# Patient Record
Sex: Female | Born: 2000 | Hispanic: Yes | Marital: Single | Smoking: Never smoker
Health system: Southern US, Community
[De-identification: ages and names within clinical notes are randomized; demographics above are authoritative.]

## PROBLEM LIST (undated history)

## (undated) DIAGNOSIS — J45909 Unspecified asthma, uncomplicated: Secondary | ICD-10-CM

## (undated) DIAGNOSIS — F419 Anxiety disorder, unspecified: Secondary | ICD-10-CM

## (undated) DIAGNOSIS — G90A Postural orthostatic tachycardia syndrome (POTS): Secondary | ICD-10-CM

## (undated) DIAGNOSIS — D649 Anemia, unspecified: Secondary | ICD-10-CM

## (undated) DIAGNOSIS — F32A Depression, unspecified: Secondary | ICD-10-CM

---
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
## 2021-09-30 ENCOUNTER — Encounter (HOSPITAL_BASED_OUTPATIENT_CLINIC_OR_DEPARTMENT_OTHER): Payer: Self-pay | Admitting: Obstetrics and Gynecology

## 2021-09-30 ENCOUNTER — Other Ambulatory Visit: Payer: Self-pay

## 2021-09-30 ENCOUNTER — Emergency Department (HOSPITAL_BASED_OUTPATIENT_CLINIC_OR_DEPARTMENT_OTHER)
Admission: EM | Admit: 2021-09-30 | Discharge: 2021-09-30 | Disposition: A | Discharge status: Home / Self Care | Payer: BC Managed Care – PPO | Attending: Emergency Medicine | Admitting: Emergency Medicine

## 2021-09-30 ENCOUNTER — Emergency Department (HOSPITAL_BASED_OUTPATIENT_CLINIC_OR_DEPARTMENT_OTHER): Payer: BC Managed Care – PPO

## 2021-09-30 DIAGNOSIS — D72829 Elevated white blood cell count, unspecified: Secondary | ICD-10-CM | POA: Diagnosis not present

## 2021-09-30 DIAGNOSIS — R112 Nausea with vomiting, unspecified: Secondary | ICD-10-CM | POA: Diagnosis present

## 2021-09-30 DIAGNOSIS — J45909 Unspecified asthma, uncomplicated: Secondary | ICD-10-CM | POA: Diagnosis not present

## 2021-09-30 DIAGNOSIS — K529 Noninfective gastroenteritis and colitis, unspecified: Secondary | ICD-10-CM | POA: Diagnosis not present

## 2021-09-30 HISTORY — DX: Depression, unspecified: F32.A

## 2021-09-30 HISTORY — DX: Anxiety disorder, unspecified: F41.9

## 2021-09-30 HISTORY — DX: Unspecified asthma, uncomplicated: J45.909

## 2021-09-30 LAB — URINALYSIS, ROUTINE W REFLEX MICROSCOPIC
Bilirubin Urine: NEGATIVE
Glucose, UA: NEGATIVE mg/dL
Hgb urine dipstick: NEGATIVE
Ketones, ur: 40 mg/dL — AB
Nitrite: NEGATIVE
Specific Gravity, Urine: 1.027 (ref 1.005–1.030)
pH: 6.5 (ref 5.0–8.0)

## 2021-09-30 LAB — LIPASE, BLOOD: Lipase: 10 U/L — ABNORMAL LOW (ref 11–51)

## 2021-09-30 LAB — CBC
HCT: 40.4 % (ref 36.0–46.0)
Hemoglobin: 13.7 g/dL (ref 12.0–15.0)
MCH: 29 pg (ref 26.0–34.0)
MCHC: 33.9 g/dL (ref 30.0–36.0)
MCV: 85.4 fL (ref 80.0–100.0)
Platelets: 336 10*3/uL (ref 150–400)
RBC: 4.73 MIL/uL (ref 3.87–5.11)
RDW: 12.2 % (ref 11.5–15.5)
WBC: 12 10*3/uL — ABNORMAL HIGH (ref 4.0–10.5)
nRBC: 0 % (ref 0.0–0.2)

## 2021-09-30 LAB — COMPREHENSIVE METABOLIC PANEL
ALT: 19 U/L (ref 0–44)
AST: 16 U/L (ref 15–41)
Albumin: 4.6 g/dL (ref 3.5–5.0)
Alkaline Phosphatase: 52 U/L (ref 38–126)
Anion gap: 12 (ref 5–15)
BUN: 13 mg/dL (ref 6–20)
CO2: 23 mmol/L (ref 22–32)
Calcium: 9.1 mg/dL (ref 8.9–10.3)
Chloride: 106 mmol/L (ref 98–111)
Creatinine, Ser: 0.54 mg/dL (ref 0.44–1.00)
GFR, Estimated: >60 mL/min (ref >60)
Glucose, Bld: 98 mg/dL (ref 70–99)
Potassium: 3.7 mmol/L (ref 3.5–5.1)
Sodium: 141 mmol/L (ref 135–145)
Total Bilirubin: 0.5 mg/dL (ref 0.3–1.2)
Total Protein: 7.3 g/dL (ref 6.5–8.1)

## 2021-09-30 LAB — PREGNANCY, URINE: Preg Test, Ur: NEGATIVE

## 2021-09-30 MED ORDER — ONDANSETRON HCL 4 MG PO TABS: 4.0000 mg | ORAL_TABLET | Freq: Four times a day (QID) | ORAL | 0 refills | Status: DC

## 2021-09-30 MED ORDER — IOHEXOL 300 MG/ML  SOLN
80.0000 mL | Freq: Once | INTRAMUSCULAR | Status: AC | PRN
  Administered 2021-09-30: 80 mL via INTRAVENOUS

## 2021-09-30 MED ORDER — ONDANSETRON HCL 4 MG/2ML IJ SOLN
4.0000 mg | Freq: Once | INTRAMUSCULAR | Status: AC | PRN
  Administered 2021-09-30: 4 mg via INTRAVENOUS
  Filled 2021-09-30: qty 2

## 2021-09-30 MED ORDER — DICYCLOMINE HCL 20 MG PO TABS: 20.0000 mg | ORAL_TABLET | Freq: Two times a day (BID) | ORAL | 0 refills | Status: DC

## 2021-09-30 MED ORDER — ACETAMINOPHEN 325 MG PO TABS
650.0000 mg | ORAL_TABLET | Freq: Once | ORAL | Status: AC
  Administered 2021-09-30: 650 mg via ORAL
  Filled 2021-09-30: qty 2

## 2021-09-30 MED ORDER — ONDANSETRON HCL 4 MG/2ML IJ SOLN
4.0000 mg | Freq: Once | INTRAMUSCULAR | Status: AC
  Administered 2021-09-30: 4 mg via INTRAVENOUS
  Filled 2021-09-30: qty 2

## 2021-09-30 MED ORDER — SODIUM CHLORIDE 0.9 % IV BOLUS
1000.0000 mL | Freq: Once | INTRAVENOUS | Status: AC
  Administered 2021-09-30: 1000 mL via INTRAVENOUS

## 2021-09-30 MED ORDER — DICYCLOMINE HCL 10 MG PO CAPS
10.0000 mg | ORAL_CAPSULE | Freq: Once | ORAL | Status: AC
  Administered 2021-09-30: 10 mg via ORAL
  Filled 2021-09-30: qty 1

## 2021-09-30 NOTE — ED Notes (Signed)
Tolerating PO fluids and crackers well, w/o nausea and/or emesis

## 2021-09-30 NOTE — Discharge Instructions (Signed)
The CT scan of your abdomen did not show any acute abnormalities including no evidence of a problem with your appendix, gallbladder kidneys or liver.  Your work-up was very reassuring today.  I suspect that you likely have viral gastroenteritis which is typically something that resolves on its own.  I prescribed a medication called Zofran and Bentyl to help with your nausea and abdominal discomfort.  Please take as directed.  Please follow-up with your regular doctor in the next 5 to 7 days for reassessment and return to the emergency department for any new or worsening symptoms.

## 2021-09-30 NOTE — ED Provider Notes (Signed)
[facility] EMERGENCY DEPT Provider Note   CSN: [id number] Arrival date & time: 09/30/21  1758     History Chief Complaint  Patient presents with   Emesis    Audrey Callahan is a 20 y.o. female.  HPI  20 year old female with a history of anxiety, asthma, depression, who presents to the emergency department today for evaluation of nausea and vomiting.  She also has had 1 episode of diarrhea and a normal stool today.  She has had some abdominal pain located to the periumbilical area.  She states that this pain is intermittent.  She denies any associated fevers or chills.  She denies any urinary or vaginal complaints.  She denies any sick contacts.  Past Medical History:  Diagnosis Date   Anxiety    Asthma    Depression     There are no problems to display for this patient.   History reviewed. No pertinent surgical history.   OB History     Gravida  0   Para  0   Term  0   Preterm  0   AB  0   Living  0      SAB  0   IAB  0   Ectopic  0   Multiple  0   Live Births  0           No family history on file.  Social History   Tobacco Use   Smoking status: Never    Passive exposure: Never   Smokeless tobacco: Never  Vaping Use   Vaping Use: Never used  Substance Use Topics   Alcohol use: Yes    Alcohol/week: 7.0 standard drinks    Types: 7 Glasses of wine per week   Drug use: Never    Home Medications Prior to Admission medications   Medication Sig Start Date End Date Taking? Authorizing Provider  dicyclomine (BENTYL) 20 MG tablet Take 1 tablet (20 mg total) by mouth 2 (two) times daily. 09/30/21  Yes Barbie Croston S, PA-C  ondansetron (ZOFRAN) 4 MG tablet Take 1 tablet (4 mg total) by mouth every 6 (six) hours. 09/30/21  Yes Irvan Tiedt S, PA-C    Allergies    Lidocaine viscous hcl  Review of Systems   Review of Systems  Constitutional:  Negative for chills and fever.  HENT:  Negative for ear pain and sore  throat.   Eyes:  Negative for visual disturbance.  Respiratory:  Negative for cough and shortness of breath.   Cardiovascular:  Negative for chest pain.  Gastrointestinal:  Positive for abdominal pain, diarrhea, nausea and vomiting.  Genitourinary:  Negative for dysuria and hematuria.  Musculoskeletal:  Negative for back pain.  Skin:  Negative for rash.  Neurological:  Negative for syncope and headaches.  All other systems reviewed and are negative.  Physical Exam Updated Vital Signs BP 104/60 (BP Location: Right Arm)   Pulse 91   Temp 99 F (37.2 C)   Resp 16   Ht 5' 4.5" (1.638 m)   Wt 56.7 kg   LMP 08/08/2021 (Exact Date) Comment: On BCP, Period q53months  SpO2 100%   BMI 21.12 kg/m   Physical Exam Vitals and nursing note reviewed.  Constitutional:      General: She is not in acute distress.    Appearance: She is well-developed.  HENT:     Head: Normocephalic and atraumatic.  Eyes:     Conjunctiva/sclera: Conjunctivae normal.  Cardiovascular:     Rate  and Rhythm: Normal rate and regular rhythm.     Heart sounds: Normal heart sounds. No murmur heard. Pulmonary:     Effort: Pulmonary effort is normal. No respiratory distress.     Breath sounds: Normal breath sounds. No wheezing, rhonchi or rales.  Abdominal:     General: Bowel sounds are normal.     Palpations: Abdomen is soft.     Tenderness: There is abdominal tenderness (ruq, periumbilical). There is no guarding or rebound.  Musculoskeletal:     Cervical back: Neck supple.  Skin:    General: Skin is warm and dry.  Neurological:     Mental Status: She is alert.    ED Results / Procedures / Treatments   Labs (all labs ordered are listed, but only abnormal results are displayed) Labs Reviewed  LIPASE, BLOOD - Abnormal; Notable for the following components:      Result Value   Lipase 10 (*)    All other components within normal limits  CBC - Abnormal; Notable for the following components:   WBC 12.0 (*)     All other components within normal limits  URINALYSIS, ROUTINE W REFLEX MICROSCOPIC - Abnormal; Notable for the following components:   Ketones, ur 40 (*)    Protein, ur TRACE (*)    Leukocytes,Ua SMALL (*)    Bacteria, UA RARE (*)    All other components within normal limits  COMPREHENSIVE METABOLIC PANEL  PREGNANCY, URINE    EKG None  Radiology CT ABDOMEN PELVIS W CONTRAST  Result Date: 09/30/2021 CLINICAL DATA:  Abdominal pain EXAM: CT ABDOMEN AND PELVIS WITH CONTRAST TECHNIQUE: Multidetector CT imaging of the abdomen and pelvis was performed using the standard protocol following bolus administration of intravenous contrast. CONTRAST:  51mL OMNIPAQUE IOHEXOL 300 MG/ML  SOLN COMPARISON:  None. FINDINGS: Lower chest: Lung bases are clear. Hepatobiliary: Liver is within normal limits. Gallbladder is unremarkable. No intrahepatic or extrahepatic ductal dilatation. Pancreas: Within normal limits. Spleen: Within normal limits Adrenals/Urinary Tract: Adrenal glands are within normal limits. Kidneys are within normal limits.  No hydronephrosis. Bladder is within normal limits. Stomach/Bowel: Stomach is within normal limits. No evidence of bowel obstruction. Normal appendix (series 2/image 65). No colonic wall thickening or inflammatory changes. Vascular/Lymphatic: No evidence of abdominal aortic aneurysm. No suspicious abdominopelvic lymphadenopathy. Reproductive: Retroverted uterus. Bilateral ovaries are within normal limits. Other: No abdominopelvic ascites. Musculoskeletal: Visualized osseous structures are within normal limits. IMPRESSION: Negative CT abdomen/pelvis. Electronically Signed   By: Charline Bills M.D.   On: 09/30/2021 20:44    Procedures Procedures   Medications Ordered in ED Medications  ondansetron (ZOFRAN) injection 4 mg (4 mg Intravenous Given 09/30/21 1823)  sodium chloride 0.9 % bolus 1,000 mL (0 mLs Intravenous Stopped 09/30/21 2108)  ondansetron (ZOFRAN) injection 4  mg (4 mg Intravenous Given 09/30/21 1953)  dicyclomine (BENTYL) capsule 10 mg (10 mg Oral Given 09/30/21 1953)  acetaminophen (TYLENOL) tablet 650 mg (650 mg Oral Given 09/30/21 1953)  iohexol (OMNIPAQUE) 300 MG/ML solution 80 mL (80 mLs Intravenous Contrast Given 09/30/21 2025)    ED Course  I have reviewed the triage vital signs and the nursing notes.  Pertinent labs & imaging results that were available during my care of the patient were reviewed by me and considered in my medical decision making (see chart for details).    MDM Rules/Calculators/A&P  20 y/o f presents for eval of abd pain, nvd that started earlier today.   Reviewed/interpreted labs CBC with mild leukocytosis, no anemia CMP unremarkable Lipase neg UA with 40 ketones, trace protein, smal leuks, 6-10 wbc abd rare bacteria. Doubt uti Preg test neg  Reviewed/interpreted imaging CT abd/pelvis - neg for acute abnormality  Patient was given IV fluids, antiemetics and Bentyl.  On reassessment she has been able to tolerate p.o.  I have low suspicion for any emergent intra-abdominal/pelvic process at this time.  Feel she likely has gastroenteritis.  We will treat her with Zofran and Bentyl as an outpatient advised hydration and supportive care.  Advise close follow-up and strict return precautions.  She voices understanding of the plan and reasons to return.  All questions answered.  Patient stable for discharge.   Final Clinical Impression(s) / ED Diagnoses Final diagnoses:  Gastroenteritis    Rx / DC Orders ED Discharge Orders          Ordered    ondansetron (ZOFRAN) 4 MG tablet  Every 6 hours        09/30/21 2112    dicyclomine (BENTYL) 20 MG tablet  2 times daily        09/30/21 2112             Karrie Meres, [place] 09/30/21 2112    Tegeler, Canary Brim, MD 10/01/21 [phone]

## 2021-09-30 NOTE — ED Triage Notes (Signed)
Patient reports to the ER for emesis. Patient reports since 3am this morning she has had harsh emesis that is uncontrolled. Reports this has happened in the past and needed medication to help calm the emesis.

## 2021-09-30 NOTE — ED Notes (Signed)
Pt ambulated to bathroom to provide urine specimen, gait steady. Urine specimen obtained and sent to lab.

## 2021-09-30 NOTE — ED Notes (Signed)
Discharge instructions discussed with pt. Pt verbalized understanding. Pt stable and ambulatory. No signature pad available. 

## 2022-01-18 ENCOUNTER — Other Ambulatory Visit: Payer: Self-pay

## 2022-01-18 ENCOUNTER — Other Ambulatory Visit: Payer: Self-pay | Admitting: Gastroenterology

## 2022-01-18 ENCOUNTER — Ambulatory Visit
Admission: RE | Admit: 2022-01-18 | Discharge: 2022-01-18 | Disposition: A | Discharge status: Home / Self Care | Payer: BC Managed Care – PPO | Source: Ambulatory Visit | Attending: Gastroenterology | Admitting: Gastroenterology

## 2022-01-18 DIAGNOSIS — K5900 Constipation, unspecified: Secondary | ICD-10-CM

## 2022-04-02 ENCOUNTER — Other Ambulatory Visit (HOSPITAL_COMMUNITY)
Admission: RE | Admit: 2022-04-02 | Discharge: 2022-04-02 | Disposition: A | Discharge status: Home / Self Care | Payer: BC Managed Care – PPO | Source: Ambulatory Visit | Attending: Nurse Practitioner | Admitting: Nurse Practitioner

## 2022-04-02 DIAGNOSIS — R87612 Low grade squamous intraepithelial lesion on cytologic smear of cervix (LGSIL): Secondary | ICD-10-CM | POA: Insufficient documentation

## 2022-04-09 LAB — CYTOLOGY - PAP: Diagnosis: NEGATIVE

## 2022-07-04 ENCOUNTER — Other Ambulatory Visit: Payer: Self-pay | Admitting: Otolaryngology

## 2022-07-30 ENCOUNTER — Emergency Department (HOSPITAL_BASED_OUTPATIENT_CLINIC_OR_DEPARTMENT_OTHER)
Admission: EM | Admit: 2022-07-30 | Discharge: 2022-07-30 | Disposition: A | Discharge status: Home / Self Care | Payer: BC Managed Care – PPO | Attending: Emergency Medicine | Admitting: Emergency Medicine

## 2022-07-30 ENCOUNTER — Other Ambulatory Visit: Payer: Self-pay

## 2022-07-30 ENCOUNTER — Encounter (HOSPITAL_BASED_OUTPATIENT_CLINIC_OR_DEPARTMENT_OTHER): Payer: Self-pay | Admitting: Emergency Medicine

## 2022-07-30 DIAGNOSIS — T7840XA Allergy, unspecified, initial encounter: Secondary | ICD-10-CM

## 2022-07-30 DIAGNOSIS — R21 Rash and other nonspecific skin eruption: Secondary | ICD-10-CM | POA: Diagnosis present

## 2022-07-30 DIAGNOSIS — L509 Urticaria, unspecified: Secondary | ICD-10-CM | POA: Insufficient documentation

## 2022-07-30 MED ORDER — PREDNISONE 10 MG PO TABS: 40.0000 mg | ORAL_TABLET | Freq: Every day | ORAL | 0 refills | Status: AC

## 2022-07-30 MED ORDER — FAMOTIDINE 20 MG PO TABS
20.0000 mg | ORAL_TABLET | Freq: Once | ORAL | Status: AC
  Administered 2022-07-30: 20 mg via ORAL
  Filled 2022-07-30: qty 1

## 2022-07-30 MED ORDER — DIPHENHYDRAMINE HCL 25 MG PO CAPS
25.0000 mg | ORAL_CAPSULE | Freq: Once | ORAL | Status: AC
  Administered 2022-07-30: 25 mg via ORAL
  Filled 2022-07-30: qty 1

## 2022-07-30 MED ORDER — PREDNISONE 50 MG PO TABS
60.0000 mg | ORAL_TABLET | Freq: Once | ORAL | Status: AC
  Administered 2022-07-30: 60 mg via ORAL
  Filled 2022-07-30: qty 1

## 2022-07-30 NOTE — ED Triage Notes (Signed)
Started at 2pm, stared with rash on face and now is complaining of difficulty swallowing, harshness of voice Tongue is pink and slightly swollen. Controlling secretions at this time  Took OTC benadryl at home Unsure cause, denies new meds, foods , products

## 2022-07-30 NOTE — Discharge Instructions (Signed)
You were seen in the emergency department for your allergic reaction.  You had no signs of anaphylaxis that required an EpiPen.  We gave you some Benadryl, Pepcid and steroids with improvement of your rash, itching and swelling.  I have given you a short course of steroids that you should complete as prescribed and you can continue to take Benadryl or Pepcid as needed for any itching or rash.  You should follow-up with your primary doctor in the next few days to have your symptoms rechecked and you can follow-up with an allergist as needed.  You should return to the emergency department if you have tongue or throat swelling, you have shortness of breath, you have repetitive vomiting, you pass out, or if you have any other new or concerning symptoms.

## 2022-07-30 NOTE — ED Notes (Signed)
RT in to assess. Airway patent with BBS clear and no stridor noted. SpO2 99% at this time.

## 2022-07-30 NOTE — ED Provider Notes (Signed)
[facility] EMERGENCY DEPT Provider Note   CSN: [id number] Arrival date & time: 07/30/22  1936     History  Chief Complaint  Patient presents with   Allergic Reaction    Audrey Callahan is a 21 y.o. female.  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  The patient states that she took a shower last night and wash her face with her normal face wash.  She states that she did not place anything on her face this morning and had not had anything yet for breakfast and around 10 AM started to notice a rash and swelling with itching to the left side of her face.  She states it did not go away by noon so she took some Benadryl.  She states around 2:00 she started to feel like her throat was swelling.  She states that she continue to monitor her symptoms throughout the day and since they do not resolve decided to come to the emergency department.  She denies any nausea, vomiting.  She states she did have 1 episode of diarrhea this morning.  She denies any rash on her arms or legs.  She denies any shortness of breath.  The history is provided by the patient.  Allergic Reaction      Home Medications Prior to Admission medications   Medication Sig Start Date End Date Taking? Authorizing Provider  dicyclomine (BENTYL) 20 MG tablet Take 1 tablet (20 mg total) by mouth 2 (two) times daily. 09/30/21   Couture, Cortni S, PA-C  ondansetron (ZOFRAN) 4 MG tablet Take 1 tablet (4 mg total) by mouth every 6 (six) hours. 09/30/21   Couture, Cortni S, PA-C      Allergies    Lidocaine viscous hcl    Review of Systems   Review of Systems  Physical Exam Updated Vital Signs BP 114/85   Pulse 73   Temp 98 F (36.7 C)   Resp (!) 21   LMP 06/17/2022 (Approximate)   SpO2 98%  Physical Exam Vitals and nursing note reviewed.  Constitutional:      General: She is not in  acute distress.    Appearance: Normal appearance.  HENT:     Head: Normocephalic and atraumatic.     Nose: Nose normal.     Mouth/Throat:     Mouth: Mucous membranes are moist.     Pharynx: Oropharynx is clear.     Comments: No tongue swelling, no swelling to posterior oropharynx, normal phonation, tolerating secretions Eyes:     Extraocular Movements: Extraocular movements intact.     Conjunctiva/sclera: Conjunctivae normal.  Cardiovascular:     Rate and Rhythm: Normal rate and regular rhythm.     Pulses: Normal pulses.  Pulmonary:     Effort: Pulmonary effort is normal. No respiratory distress.     Breath sounds: No wheezing.  Abdominal:     General: Abdomen is flat.     Palpations: Abdomen is soft.     Tenderness: There is no abdominal tenderness.  Musculoskeletal:        General: Normal range of motion.     Cervical back: Normal range of motion and neck supple.  Skin:    General: Skin is warm and dry.     Comments: Small areas of hives on left cheek, no hives visualized on arms or legs or thorax  Neurological:     General:  No focal deficit present.     Mental Status: She is alert and oriented to person, place, and time.  Psychiatric:        Mood and Affect: Mood normal.        Behavior: Behavior normal.     ED Results / Procedures / Treatments   Labs (all labs ordered are listed, but only abnormal results are displayed) Labs Reviewed - No data to display  EKG None  Radiology No results found.  Procedures Procedures    Medications Ordered in ED Medications  diphenhydrAMINE (BENADRYL) capsule 25 mg (25 mg Oral Given 07/30/22 2253)  predniSONE (DELTASONE) tablet 60 mg (60 mg Oral Given 07/30/22 2253)  famotidine (PEPCID) tablet 20 mg (20 mg Oral Given 07/30/22 2254)    ED Course/ Medical Decision Making/ A&P                           Medical Decision Making This patient presents to the ED with chief complaint(s) of rash, itching and throat swelling with  pertinent past medical history of allergic reactions which further complicates the presenting complaint. The complaint involves an extensive differential diagnosis and also carries with it a high risk of complications and morbidity.    The differential diagnosis includes patient has no wheezing, no swelling of her oropharynx and is hemodynamically stable here without signs of anaphylaxis.  She does have some hives to her left cheek with itching, concerning for allergic reaction.  She will be given an additional dose of Benadryl as well as steroids and Pepcid and be monitored for 1 hour and reassess.  Additional history obtained: N/A  ED Course and Reassessment: Upon reassessment, the patient reports improvement of her symptoms.  She continues to have no swelling to her oropharynx and lungs remain clear and she is hemodynamically stable.  She is stable for discharge home.  She was given a short course of steroids and was recommended to take Benadryl and Pepcid as needed.  She was given strict return precautions.  Independent labs interpretation:  The following labs were independently interpreted: N/A  Independent visualization of imaging: N/A  Consultation: - Consulted or discussed management/test interpretation w/ external professional: NA  Consideration for admission or further workup:N/A Social Determinants of health:N/A    Risk Prescription drug management.           Final Clinical Impression(s) / ED Diagnoses Final diagnoses:  None    Rx / DC Orders ED Discharge Orders     None         Phoebe Sharps, DO 07/30/22 2348

## 2022-07-30 NOTE — ED Notes (Signed)
Pt discharged home after verbalizing understanding of discharge instructions; nad noted. 

## 2022-08-27 ENCOUNTER — Other Ambulatory Visit: Payer: Self-pay

## 2022-08-27 ENCOUNTER — Encounter (HOSPITAL_COMMUNITY): Payer: Self-pay | Admitting: Otolaryngology

## 2022-08-27 NOTE — Progress Notes (Signed)
SDW CALL  Patient was given pre-op instructions over the phone. The opportunity was given for the patient to ask questions. No further questions asked. Patient verbalized understanding of instructions given.   PCP - Peri Maris, FNP   PPM/ICD - denies   Chest x-ray - denies EKG - requested Stress Test - requested ECHO - denies Cardiac Cath - denies  Sleep Study - denies    Blood Thinner Instructions: n/a Aspirin Instructions: n/a  ERAS Protcol - clears until 1000   COVID TEST- n/a   Anesthesia review: no  Patient denies shortness of breath, fever, cough and chest pain over the phone call   All instructions explained to the patient, with a verbal understanding of the material. Patient agrees to go over the instructions while at home for a better understanding.

## 2022-08-27 NOTE — Anesthesia Preprocedure Evaluation (Addendum)
Anesthesia Evaluation  Patient identified by MRN, date of birth, ID band Patient awake    Reviewed: Allergy & Precautions, NPO status , Patient's Chart, lab work & pertinent test results  Airway Mallampati: I  TM Distance: >3 FB Neck ROM: Full    Dental no notable dental hx.    Pulmonary asthma (well controlled) ,    Pulmonary exam normal breath sounds clear to auscultation       Cardiovascular negative cardio ROS Normal cardiovascular exam Rhythm:Regular Rate:Normal     Neuro/Psych PSYCHIATRIC DISORDERS Anxiety Depression negative neurological ROS     GI/Hepatic Neg liver ROS, GERD  Controlled and Medicated,  Endo/Other  negative endocrine ROS  Renal/GU negative Renal ROS  negative genitourinary   Musculoskeletal negative musculoskeletal ROS (+)   Abdominal   Peds  Hematology negative hematology ROS (+)   Anesthesia Other Findings   Reproductive/Obstetrics negative OB ROS                            Anesthesia Physical Anesthesia Plan  ASA: 2  Anesthesia Plan: General   Post-op Pain Management: Tylenol PO (pre-op)*   Induction: Intravenous  PONV Risk Score and Plan: 4 or greater and Ondansetron, Dexamethasone, Midazolam, Scopolamine patch - Pre-op and Treatment may vary due to age or medical condition  Airway Management Planned: Oral ETT  Additional Equipment: None  Intra-op Plan:   Post-operative Plan: Extubation in OR  Informed Consent: I have reviewed the patients History and Physical, chart, labs and discussed the procedure including the risks, benefits and alternatives for the proposed anesthesia with the patient or authorized representative who has indicated his/her understanding and acceptance.     Dental advisory given  Plan Discussed with: CRNA  Anesthesia Plan Comments:        Anesthesia Quick Evaluation

## 2022-08-28 ENCOUNTER — Other Ambulatory Visit: Payer: Self-pay

## 2022-08-28 ENCOUNTER — Emergency Department (HOSPITAL_BASED_OUTPATIENT_CLINIC_OR_DEPARTMENT_OTHER): Payer: BC Managed Care – PPO

## 2022-08-28 ENCOUNTER — Encounter (HOSPITAL_BASED_OUTPATIENT_CLINIC_OR_DEPARTMENT_OTHER): Payer: Self-pay | Admitting: Emergency Medicine

## 2022-08-28 ENCOUNTER — Encounter (HOSPITAL_COMMUNITY): Payer: Self-pay | Admitting: Otolaryngology

## 2022-08-28 ENCOUNTER — Inpatient Hospital Stay (HOSPITAL_BASED_OUTPATIENT_CLINIC_OR_DEPARTMENT_OTHER)
Admission: EM | Admit: 2022-08-28 | Discharge: 2022-09-01 | DRG: 987 | Disposition: A | Discharge status: Home / Self Care | Payer: BC Managed Care – PPO | Attending: Pulmonary Disease | Admitting: Pulmonary Disease

## 2022-08-28 ENCOUNTER — Ambulatory Visit (HOSPITAL_COMMUNITY): Payer: BC Managed Care – PPO | Admitting: Anesthesiology

## 2022-08-28 ENCOUNTER — Encounter (HOSPITAL_COMMUNITY)
Admission: RE | Disposition: A | Discharge status: Home / Self Care | Payer: Self-pay | Source: Home / Self Care | Attending: Otolaryngology

## 2022-08-28 ENCOUNTER — Ambulatory Visit (HOSPITAL_COMMUNITY)
Admission: RE | Admit: 2022-08-28 | Discharge: 2022-08-28 | Disposition: A | Discharge status: Home / Self Care | Payer: BC Managed Care – PPO | Source: Home / Self Care | Attending: Otolaryngology | Admitting: Otolaryngology

## 2022-08-28 ENCOUNTER — Encounter (HOSPITAL_COMMUNITY): Payer: Self-pay

## 2022-08-28 ENCOUNTER — Other Ambulatory Visit (HOSPITAL_COMMUNITY): Payer: Self-pay

## 2022-08-28 DIAGNOSIS — Z91048 Other nonmedicinal substance allergy status: Secondary | ICD-10-CM

## 2022-08-28 DIAGNOSIS — R040 Epistaxis: Secondary | ICD-10-CM | POA: Diagnosis present

## 2022-08-28 DIAGNOSIS — Z884 Allergy status to anesthetic agent status: Secondary | ICD-10-CM

## 2022-08-28 DIAGNOSIS — J342 Deviated nasal septum: Secondary | ICD-10-CM | POA: Diagnosis present

## 2022-08-28 DIAGNOSIS — R0981 Nasal congestion: Secondary | ICD-10-CM | POA: Diagnosis present

## 2022-08-28 DIAGNOSIS — J3489 Other specified disorders of nose and nasal sinuses: Secondary | ICD-10-CM | POA: Insufficient documentation

## 2022-08-28 DIAGNOSIS — J9589 Other postprocedural complications and disorders of respiratory system, not elsewhere classified: Principal | ICD-10-CM | POA: Diagnosis present

## 2022-08-28 DIAGNOSIS — G928 Other toxic encephalopathy: Secondary | ICD-10-CM | POA: Diagnosis present

## 2022-08-28 DIAGNOSIS — J343 Hypertrophy of nasal turbinates: Secondary | ICD-10-CM | POA: Insufficient documentation

## 2022-08-28 DIAGNOSIS — I248 Other forms of acute ischemic heart disease: Secondary | ICD-10-CM | POA: Diagnosis not present

## 2022-08-28 DIAGNOSIS — J690 Pneumonitis due to inhalation of food and vomit: Secondary | ICD-10-CM | POA: Diagnosis present

## 2022-08-28 DIAGNOSIS — J45909 Unspecified asthma, uncomplicated: Secondary | ICD-10-CM | POA: Diagnosis present

## 2022-08-28 DIAGNOSIS — T380X5A Adverse effect of glucocorticoids and synthetic analogues, initial encounter: Secondary | ICD-10-CM | POA: Diagnosis present

## 2022-08-28 DIAGNOSIS — I428 Other cardiomyopathies: Secondary | ICD-10-CM | POA: Diagnosis present

## 2022-08-28 DIAGNOSIS — T8859XA Other complications of anesthesia, initial encounter: Secondary | ICD-10-CM | POA: Diagnosis present

## 2022-08-28 DIAGNOSIS — K219 Gastro-esophageal reflux disease without esophagitis: Secondary | ICD-10-CM | POA: Diagnosis present

## 2022-08-28 DIAGNOSIS — Y708 Miscellaneous anesthesiology devices associated with adverse incidents, not elsewhere classified: Secondary | ICD-10-CM | POA: Diagnosis present

## 2022-08-28 DIAGNOSIS — Y838 Other surgical procedures as the cause of abnormal reaction of the patient, or of later complication, without mention of misadventure at the time of the procedure: Secondary | ICD-10-CM | POA: Diagnosis present

## 2022-08-28 DIAGNOSIS — F32A Depression, unspecified: Secondary | ICD-10-CM | POA: Diagnosis present

## 2022-08-28 DIAGNOSIS — J9601 Acute respiratory failure with hypoxia: Secondary | ICD-10-CM

## 2022-08-28 DIAGNOSIS — M950 Acquired deformity of nose: Secondary | ICD-10-CM | POA: Insufficient documentation

## 2022-08-28 DIAGNOSIS — Z20822 Contact with and (suspected) exposure to covid-19: Secondary | ICD-10-CM | POA: Diagnosis present

## 2022-08-28 DIAGNOSIS — I5181 Takotsubo syndrome: Secondary | ICD-10-CM | POA: Diagnosis present

## 2022-08-28 DIAGNOSIS — J189 Pneumonia, unspecified organism: Secondary | ICD-10-CM | POA: Diagnosis present

## 2022-08-28 DIAGNOSIS — I420 Dilated cardiomyopathy: Secondary | ICD-10-CM | POA: Diagnosis present

## 2022-08-28 DIAGNOSIS — I5021 Acute systolic (congestive) heart failure: Secondary | ICD-10-CM

## 2022-08-28 DIAGNOSIS — I959 Hypotension, unspecified: Secondary | ICD-10-CM

## 2022-08-28 DIAGNOSIS — E739 Lactose intolerance, unspecified: Secondary | ICD-10-CM | POA: Diagnosis present

## 2022-08-28 DIAGNOSIS — G4700 Insomnia, unspecified: Secondary | ICD-10-CM | POA: Diagnosis present

## 2022-08-28 DIAGNOSIS — R7989 Other specified abnormal findings of blood chemistry: Secondary | ICD-10-CM

## 2022-08-28 HISTORY — DX: Anemia, unspecified: D64.9

## 2022-08-28 HISTORY — PX: NASAL HEMORRHAGE CONTROL: SHX287

## 2022-08-28 HISTORY — PX: NASAL SEPTUM SURGERY: SHX37

## 2022-08-28 HISTORY — PX: NASAL SEPTOPLASTY W/ TURBINOPLASTY: SHX2070

## 2022-08-28 LAB — CBC
HCT: 39.8 % (ref 36.0–46.0)
HCT: 39.8 % (ref 36.0–46.0)
Hemoglobin: 13.5 g/dL (ref 12.0–15.0)
Hemoglobin: 13.7 g/dL (ref 12.0–15.0)
MCH: 29.5 pg (ref 26.0–34.0)
MCH: 29.5 pg (ref 26.0–34.0)
MCHC: 33.9 g/dL (ref 30.0–36.0)
MCHC: 34.4 g/dL (ref 30.0–36.0)
MCV: 87.1 fL (ref 80.0–100.0)
MCV: 85.6 fL (ref 80.0–100.0)
Platelets: 298 10*3/uL (ref 150–400)
Platelets: 326 10*3/uL (ref 150–400)
RBC: 4.57 MIL/uL (ref 3.87–5.11)
RBC: 4.65 MIL/uL (ref 3.87–5.11)
RDW: 12.5 % (ref 11.5–15.5)
RDW: 12.4 % (ref 11.5–15.5)
WBC: 5.5 10*3/uL (ref 4.0–10.5)
WBC: 16.1 10*3/uL — ABNORMAL HIGH (ref 4.0–10.5)
nRBC: 0 % (ref 0.0–0.2)
nRBC: 0 % (ref 0.0–0.2)

## 2022-08-28 LAB — BASIC METABOLIC PANEL
Anion gap: 12 (ref 5–15)
BUN: 13 mg/dL (ref 6–20)
CO2: 22 mmol/L (ref 22–32)
Calcium: 8.8 mg/dL — ABNORMAL LOW (ref 8.9–10.3)
Chloride: 100 mmol/L (ref 98–111)
Creatinine, Ser: 0.62 mg/dL (ref 0.44–1.00)
GFR, Estimated: >60 mL/min (ref >60)
Glucose, Bld: 168 mg/dL — ABNORMAL HIGH (ref 70–99)
Potassium: 4.1 mmol/L (ref 3.5–5.1)
Sodium: 134 mmol/L — ABNORMAL LOW (ref 135–145)

## 2022-08-28 LAB — LACTIC ACID, PLASMA: Lactic Acid, Venous: 1.8 mmol/L (ref 0.5–1.9)

## 2022-08-28 LAB — SARS CORONAVIRUS 2 BY RT PCR: SARS Coronavirus 2 by RT PCR: NEGATIVE

## 2022-08-28 LAB — POCT PREGNANCY, URINE: Preg Test, Ur: NEGATIVE

## 2022-08-28 SURGERY — SEPTOPLASTY, NOSE, WITH NASAL TURBINATE REDUCTION
Anesthesia: General | Site: Nose | Laterality: Bilateral

## 2022-08-28 MED ORDER — ORAL CARE MOUTH RINSE
15.0000 mL | OROMUCOSAL | Status: DC | PRN
  Filled 2022-08-28: qty 15

## 2022-08-28 MED ORDER — ACETAMINOPHEN 500 MG PO TABS
1000.0000 mg | ORAL_TABLET | Freq: Once | ORAL | Status: DC
  Filled 2022-08-28: qty 2

## 2022-08-28 MED ORDER — IPRATROPIUM BROMIDE 0.02 % IN SOLN
0.5000 mg | Freq: Once | RESPIRATORY_TRACT | Status: AC
  Administered 2022-08-28: 0.5 mg via RESPIRATORY_TRACT
  Filled 2022-08-28: qty 2.5

## 2022-08-28 MED ORDER — DEXMEDETOMIDINE HCL IN NACL 80 MCG/20ML IV SOLN: INTRAVENOUS | Status: DC | PRN

## 2022-08-28 MED ORDER — BACITRACIN ZINC 500 UNIT/GM EX OINT
TOPICAL_OINTMENT | CUTANEOUS | Status: AC
  Filled 2022-08-28: qty 28.35

## 2022-08-28 MED ORDER — CEFAZOLIN SODIUM-DEXTROSE 2-4 GM/100ML-% IV SOLN
2.0000 g | INTRAVENOUS | Status: AC
  Administered 2022-08-28: 2 g via INTRAVENOUS
  Filled 2022-08-28: qty 100

## 2022-08-28 MED ORDER — LIDOCAINE-EPINEPHRINE 1 %-1:100000 IJ SOLN
INTRAMUSCULAR | Status: AC
  Filled 2022-08-28: qty 1

## 2022-08-28 MED ORDER — EPHEDRINE SULFATE-NACL 50-0.9 MG/10ML-% IV SOSY
PREFILLED_SYRINGE | INTRAVENOUS | Status: DC | PRN
  Administered 2022-08-28: 10 mg via INTRAVENOUS

## 2022-08-28 MED ORDER — ONDANSETRON HCL 4 MG/2ML IJ SOLN
INTRAMUSCULAR | Status: DC | PRN
  Administered 2022-08-28: 4 mg via INTRAVENOUS

## 2022-08-28 MED ORDER — CHLORHEXIDINE GLUCONATE CLOTH 2 % EX PADS
6.0000 | MEDICATED_PAD | Freq: Every day | CUTANEOUS | Status: DC
  Administered 2022-08-29 – 2022-08-31 (×3): 6 via TOPICAL
  Filled 2022-08-28: qty 6

## 2022-08-28 MED ORDER — PROCHLORPERAZINE EDISYLATE 10 MG/2ML IJ SOLN
5.0000 mg | Freq: Once | INTRAMUSCULAR | Status: AC
  Administered 2022-08-28: 5 mg via INTRAVENOUS
  Filled 2022-08-28: qty 2

## 2022-08-28 MED ORDER — EPINEPHRINE HCL (NASAL) 0.1 % NA SOLN
NASAL | Status: AC
  Filled 2022-08-28: qty 30

## 2022-08-28 MED ORDER — PHENYLEPHRINE 80 MCG/ML (10ML) SYRINGE FOR IV PUSH (FOR BLOOD PRESSURE SUPPORT)
PREFILLED_SYRINGE | INTRAVENOUS | Status: AC
  Filled 2022-08-28: qty 10

## 2022-08-28 MED ORDER — FUROSEMIDE 10 MG/ML IJ SOLN: 120.0000 mg | Freq: Once | INTRAVENOUS | Status: DC

## 2022-08-28 MED ORDER — ONDANSETRON HCL 4 MG PO TABS
4.0000 mg | ORAL_TABLET | Freq: Three times a day (TID) | ORAL | 0 refills | Status: AC | PRN
  Filled 2022-08-28 (×2): qty 21, 7d supply, fill #0

## 2022-08-28 MED ORDER — DEXAMETHASONE SODIUM PHOSPHATE 10 MG/ML IJ SOLN
INTRAMUSCULAR | Status: DC | PRN
  Administered 2022-08-28: 10 mg via INTRAVENOUS

## 2022-08-28 MED ORDER — ORAL CARE MOUTH RINSE
15.0000 mL | OROMUCOSAL | Status: DC
  Administered 2022-08-29 – 2022-09-01 (×9): 15 mL via OROMUCOSAL
  Filled 2022-08-28: qty 15

## 2022-08-28 MED ORDER — OXYCODONE HCL 5 MG/5ML PO SOLN: 5.0000 mg | Freq: Once | ORAL | Status: DC | PRN

## 2022-08-28 MED ORDER — BACITRACIN 500 UNIT/GM EX OINT
TOPICAL_OINTMENT | CUTANEOUS | Status: DC | PRN
  Administered 2022-08-28: 1

## 2022-08-28 MED ORDER — PROPOFOL 10 MG/ML IV BOLUS
INTRAVENOUS | Status: AC
  Filled 2022-08-28: qty 20

## 2022-08-28 MED ORDER — LIDOCAINE-EPINEPHRINE 1 %-1:100000 IJ SOLN
INTRAMUSCULAR | Status: DC | PRN
  Administered 2022-08-28: 16 mL

## 2022-08-28 MED ORDER — FENTANYL CITRATE PF 50 MCG/ML IJ SOSY: 100.0000 ug | PREFILLED_SYRINGE | Freq: Once | INTRAMUSCULAR | Status: DC

## 2022-08-28 MED ORDER — SODIUM CHLORIDE 0.9 % IV BOLUS: 1000.0000 mL | Freq: Once | INTRAVENOUS | Status: DC

## 2022-08-28 MED ORDER — AMISULPRIDE (ANTIEMETIC) 5 MG/2ML IV SOLN: 10.0000 mg | Freq: Once | INTRAVENOUS | Status: DC | PRN

## 2022-08-28 MED ORDER — PIPERACILLIN-TAZOBACTAM 3.375 G IVPB 30 MIN
3.3750 g | Freq: Once | INTRAVENOUS | Status: AC
  Administered 2022-08-28: 3.375 g via INTRAVENOUS
  Filled 2022-08-28: qty 50

## 2022-08-28 MED ORDER — EPINEPHRINE HCL (NASAL) 0.1 % NA SOLN
NASAL | Status: DC | PRN
  Administered 2022-08-28: 1 mL via TOPICAL

## 2022-08-28 MED ORDER — ESMOLOL HCL 100 MG/10ML IV SOLN
INTRAVENOUS | Status: DC | PRN
  Administered 2022-08-28: 30 mg via INTRAVENOUS
  Administered 2022-08-28: 20 mg via INTRAVENOUS

## 2022-08-28 MED ORDER — DEXMEDETOMIDINE HCL IN NACL 80 MCG/20ML IV SOLN
INTRAVENOUS | Status: DC | PRN
  Administered 2022-08-28: 12 ug via INTRAVENOUS
  Administered 2022-08-28: 8 ug via INTRAVENOUS

## 2022-08-28 MED ORDER — MEPERIDINE HCL 25 MG/ML IJ SOLN: 6.2500 mg | INTRAMUSCULAR | Status: DC | PRN

## 2022-08-28 MED ORDER — FENTANYL CITRATE (PF) 250 MCG/5ML IJ SOLN
INTRAMUSCULAR | Status: DC | PRN
  Administered 2022-08-28: 100 ug via INTRAVENOUS
  Administered 2022-08-28: 50 ug via INTRAVENOUS

## 2022-08-28 MED ORDER — OXYCODONE HCL 5 MG PO TABS: 5.0000 mg | ORAL_TABLET | Freq: Once | ORAL | Status: DC | PRN

## 2022-08-28 MED ORDER — ATROPINE SULFATE 0.4 MG/ML IV SOLN
INTRAVENOUS | Status: AC
  Filled 2022-08-28: qty 1

## 2022-08-28 MED ORDER — ESMOLOL HCL 100 MG/10ML IV SOLN
INTRAVENOUS | Status: AC
  Filled 2022-08-28: qty 10

## 2022-08-28 MED ORDER — SODIUM CHLORIDE 0.9 % IV BOLUS
1000.0000 mL | Freq: Once | INTRAVENOUS | Status: AC
  Administered 2022-08-28: 1000 mL via INTRAVENOUS

## 2022-08-28 MED ORDER — CHLORHEXIDINE GLUCONATE 0.12 % MT SOLN: 15.0000 mL | Freq: Once | OROMUCOSAL | Status: AC

## 2022-08-28 MED ORDER — OXYMETAZOLINE HCL 0.05 % NA SOLN
NASAL | Status: DC | PRN
  Administered 2022-08-28: 1

## 2022-08-28 MED ORDER — FENTANYL CITRATE (PF) 250 MCG/5ML IJ SOLN
INTRAMUSCULAR | Status: AC
  Filled 2022-08-28: qty 5

## 2022-08-28 MED ORDER — MIDAZOLAM HCL 2 MG/2ML IJ SOLN
INTRAMUSCULAR | Status: AC
  Filled 2022-08-28: qty 2

## 2022-08-28 MED ORDER — PROPOFOL 10 MG/ML IV BOLUS
INTRAVENOUS | Status: DC | PRN
  Administered 2022-08-28: 200 mg via INTRAVENOUS

## 2022-08-28 MED ORDER — HYDROCODONE-ACETAMINOPHEN 5-325 MG PO TABS
1.0000 | ORAL_TABLET | Freq: Four times a day (QID) | ORAL | 0 refills | Status: AC | PRN
  Filled 2022-08-28: qty 20, 5d supply, fill #0

## 2022-08-28 MED ORDER — EPHEDRINE 5 MG/ML INJ
INTRAVENOUS | Status: AC
  Filled 2022-08-28: qty 10

## 2022-08-28 MED ORDER — PHENYLEPHRINE 80 MCG/ML (10ML) SYRINGE FOR IV PUSH (FOR BLOOD PRESSURE SUPPORT)
PREFILLED_SYRINGE | INTRAVENOUS | Status: DC | PRN
  Administered 2022-08-28: 240 ug via INTRAVENOUS

## 2022-08-28 MED ORDER — ONDANSETRON HCL 4 MG/2ML IJ SOLN: 4.0000 mg | Freq: Once | INTRAMUSCULAR | Status: DC | PRN

## 2022-08-28 MED ORDER — ALBUTEROL SULFATE (2.5 MG/3ML) 0.083% IN NEBU
5.0000 mg | INHALATION_SOLUTION | Freq: Once | RESPIRATORY_TRACT | Status: AC
  Administered 2022-08-28: 5 mg via RESPIRATORY_TRACT
  Filled 2022-08-28: qty 6

## 2022-08-28 MED ORDER — SCOPOLAMINE 1 MG/3DAYS TD PT72
1.0000 | MEDICATED_PATCH | TRANSDERMAL | Status: DC
  Administered 2022-08-28: 1.5 mg via TRANSDERMAL
  Filled 2022-08-28: qty 1

## 2022-08-28 MED ORDER — SUGAMMADEX SODIUM 200 MG/2ML IV SOLN
INTRAVENOUS | Status: DC | PRN
  Administered 2022-08-28: 100 mg via INTRAVENOUS

## 2022-08-28 MED ORDER — GLYCOPYRROLATE PF 0.2 MG/ML IJ SOSY
PREFILLED_SYRINGE | INTRAMUSCULAR | Status: DC | PRN
  Administered 2022-08-28: .2 mg via INTRAVENOUS

## 2022-08-28 MED ORDER — HYDROMORPHONE HCL 1 MG/ML IJ SOLN: 0.2500 mg | INTRAMUSCULAR | Status: DC | PRN

## 2022-08-28 MED ORDER — MIDAZOLAM HCL 2 MG/2ML IJ SOLN
INTRAMUSCULAR | Status: DC | PRN
  Administered 2022-08-28: 2 mg via INTRAVENOUS

## 2022-08-28 MED ORDER — ORAL CARE MOUTH RINSE
15.0000 mL | Freq: Once | OROMUCOSAL | Status: AC
  Administered 2022-08-28: 15 mL via OROMUCOSAL

## 2022-08-28 MED ORDER — 0.9 % SODIUM CHLORIDE (POUR BTL) OPTIME
TOPICAL | Status: DC | PRN
  Administered 2022-08-28: 1000 mL

## 2022-08-28 MED ORDER — LACTATED RINGERS IV SOLN: INTRAVENOUS | Status: DC

## 2022-08-28 MED ORDER — SODIUM CHLORIDE 0.9 % IV SOLN
500.0000 mg | Freq: Once | INTRAVENOUS | Status: AC
  Administered 2022-08-28: 500 mg via INTRAVENOUS
  Filled 2022-08-28: qty 5

## 2022-08-28 MED ORDER — LIDOCAINE 2% (20 MG/ML) 5 ML SYRINGE
INTRAMUSCULAR | Status: DC | PRN
  Administered 2022-08-28: 60 mg via INTRAVENOUS

## 2022-08-28 MED ORDER — ROCURONIUM BROMIDE 10 MG/ML (PF) SYRINGE
PREFILLED_SYRINGE | INTRAVENOUS | Status: DC | PRN
  Administered 2022-08-28: 40 mg via INTRAVENOUS

## 2022-08-28 MED ORDER — DOCUSATE SODIUM 100 MG PO CAPS
100.0000 mg | ORAL_CAPSULE | Freq: Two times a day (BID) | ORAL | 0 refills | Status: AC | PRN
  Filled 2022-08-28: qty 20, 10d supply, fill #0

## 2022-08-28 MED ORDER — IOHEXOL 350 MG/ML SOLN
60.0000 mL | Freq: Once | INTRAVENOUS | Status: AC | PRN
  Administered 2022-08-28: 60 mL via INTRAVENOUS

## 2022-08-28 SURGICAL SUPPLY — 28 items
BAG COUNTER SPONGE SURGICOUNT (BAG) ×1 IMPLANT
BAG SPNG CNTER NS LX DISP (BAG) ×1
BLADE INF TURB ROT M4 2 5PK (BLADE) ×1 IMPLANT
CANISTER SUCT 3000ML PPV (MISCELLANEOUS) ×1 IMPLANT
COAGULATOR SUCT SWTCH 10FR 6 (ELECTROSURGICAL) IMPLANT
DRSG NASOPORE 8CM (GAUZE/BANDAGES/DRESSINGS) IMPLANT
ELECT REM PT RETURN 9FT ADLT (ELECTROSURGICAL) ×1
ELECTRODE REM PT RTRN 9FT ADLT (ELECTROSURGICAL) IMPLANT
GAUZE SPONGE 2X2 8PLY STRL LF (GAUZE/BANDAGES/DRESSINGS) ×1 IMPLANT
GLOVE BIO SURGEON STRL SZ 6.5 (GLOVE) ×1 IMPLANT
GOWN STRL REUS W/ TWL LRG LVL3 (GOWN DISPOSABLE) ×2 IMPLANT
GOWN STRL REUS W/TWL LRG LVL3 (GOWN DISPOSABLE) ×2
KIT BASIN OR (CUSTOM PROCEDURE TRAY) ×1 IMPLANT
KIT TURNOVER KIT B (KITS) ×1 IMPLANT
NDL HYPO 25GX1X1/2 BEV (NEEDLE) ×1 IMPLANT
NEEDLE HYPO 25GX1X1/2 BEV (NEEDLE) ×1 IMPLANT
NS IRRIG 1000ML POUR BTL (IV SOLUTION) ×1 IMPLANT
PAD ARMBOARD 7.5X6 YLW CONV (MISCELLANEOUS) ×1 IMPLANT
PATTIES SURGICAL .5 X3 (DISPOSABLE) ×1 IMPLANT
SPLINT NASAL AIRWAY SILICONE (MISCELLANEOUS) IMPLANT
SPLINT NASAL DOYLE BI-VL (GAUZE/BANDAGES/DRESSINGS) ×1 IMPLANT
SUT CHROMIC 4 0 P 3 18 (SUTURE) ×1 IMPLANT
SUT PLAIN 4 0 ~~LOC~~ 1 (SUTURE) ×1 IMPLANT
SUT SILK 2 0 SH (SUTURE) ×1 IMPLANT
TOWEL GREEN STERILE FF (TOWEL DISPOSABLE) ×1 IMPLANT
TRAY ENT MC OR (CUSTOM PROCEDURE TRAY) ×1 IMPLANT
TUBE SALEM SUMP 16 FR W/ARV (TUBING) ×1 IMPLANT
TUBING EXTENTION W/L.L. (IV SETS) ×1 IMPLANT

## 2022-08-28 NOTE — ED Notes (Signed)
Report given to isabella urquijo RN [facility]/ICU

## 2022-08-28 NOTE — ED Notes (Signed)
Patient transported to CT 

## 2022-08-28 NOTE — Anesthesia Procedure Notes (Signed)
Procedure Name: Intubation Date/Time: 08/28/2022 12:25 PM  Performed by: Darletta Moll, CRNAPre-anesthesia Checklist: Patient identified, Emergency Drugs available, Suction available and Patient being monitored Patient Re-evaluated:Patient Re-evaluated prior to induction Oxygen Delivery Method: Circle system utilized Preoxygenation: Pre-oxygenation with 100% oxygen Induction Type: IV induction Ventilation: Mask ventilation without difficulty Laryngoscope Size: Mac and 3 Grade View: Grade I Tube type: Oral Tube size: 7.0 mm Number of attempts: 1 Airway Equipment and Method: Stylet and Oral airway Placement Confirmation: ETT inserted through vocal cords under direct vision, positive ETCO2 and breath sounds checked- equal and bilateral Secured at: 21 cm Tube secured with: Tape Dental Injury: Teeth and Oropharynx as per pre-operative assessment

## 2022-08-28 NOTE — Op Note (Signed)
OPERATIVE NOTE  Audrey Callahan Date/Time of Admission: 08/28/2022 10:08 AM  CSN: [id number];MRN:[id number] Attending Provider: Cheron Schaumann A, DO Room/Bed: MCPO/NONE DOB: [birth year] Age: 21 y.o.   Pre-Op Diagnosis: Deviated nasal septum Chronic nasal congestion Hypertrophy of inferior nasal turbinate Obstruction of nasal valve Epistaxis  Post-Op Diagnosis: Deviated nasal septum Chronic nasal congestion Hypertrophy of inferior nasal turbinate Obstruction of nasal valve Epistaxis  Procedure: Procedure(s): NASAL SEPTOPLASTY WITH TURBINATE REDUCTION AND REPAIR OF NASAL VALVE COLLAPSE, COMPLEX CONTROL OF EPISTAXIS  Anesthesia: General  Surgeon(s): Ameen Mostafa A Caidence Higashi, DO  Staff: Circulator: Arrington, Willaim Sheng, RN; Virgel Bouquet, RN Relief Scrub: Coralee North T Scrub Person: Madilyn Fireman, Amy E  Implants: * No implants in log *  Specimens: * No specimens in log *  Complications: None  EBL: 50 ML  Condition: stable  Operative Findings:  Severe right septal deviation with spurring, bilateral inferior turbinate hypertrophy, prominent blood vessel on right septum  Description of Operation:  Once operative consent was obtained and the site and surgery were confirmed with the patient and the operating room team, the patient was brought back to the operating room and general endotracheal anesthesia was obtained. Lidocaine 1% with 1:100,000 epinephrine was injected into the nasal septum bilaterally and inferior turbinates bilaterally Afrin-soaked pledgets were placed into the nasal cavity, and the patient was prepped and draped in sterile fashion. Attention was first turned to the left nasal vestibule. A left sided hemi-transfixion incision was made a submucoperichondrial flap was elevated on the left.  A submucous resection of  nasal septal cartilage was performed with care taken to leave a 1 cm caudal and dorsal strut. In doing this, a right-sided submucoperichondrial  flap was elevated.  The bony nasal septum was addressed by lifting up the soft tissues, separating the superior septum with a double action scissor and then removing the inferior deflected portion. The nasal spine was removed with a 47mm osteotome. With this completed, the nasal septum was midline. The submucoperichondrial flaps were returned to their anatomic position and hemi-transfixion incision was closed with interrupted 4-0 chromic gut. A 4-0 plain gut suture was used to perform a mattress style stitch in a circular direction from anterior to posterior septum to re-approximate the right and left sided flaps.  Attention was then turned to the inferior turbinates.They were outfractured and then submucous resection was performed by making an incision in the leading edge with a 15 blade, separating the mucosa from bone with a Cottle elevator and then using the micro debrider with a turbinate blade to remove bone.  Alar baton grafts were fashioned from the removed septal cartilage. An incision was placed over the inferior turbinate attachment, over the pyriform aperture bilaterally. A pocket the size of the alar baton graft was made and the graft was placed into the pocket. A 5-0 Chromic interrupted suture was used to close the incision.   Suction cautery was then used to cauterize the prominent blood vessel noted on the right caudal septum.  Bacitracin covered Ralph Leyden nasal splints were placed in the bilateral nasal cavities and sutured to the columella with a 2-0 Silk suture and a nasal drip pad was applied. An orogastric tube was placed and the stomach cavity was suctioned to reduce postoperative nausea. The patient was turned over to anesthesia service and was extubated in the operating room and transferred to the PACU in stable condition. The patient will be discharged today and followed up in the ENT clinic in 1 week for  splint removal and  a postoperative check.    Laren Boom, DO [facility]   08/28/2022

## 2022-08-28 NOTE — H&P (Signed)
Audrey Callahan is an 21 y.o. female.    Chief Complaint:  Nasal congestion  HPI: Patient presents today for planned elective procedure.  He/she denies any interval change in history since office visit on 04/26/2022:  Audrey Callahan is a 21 y.o. female who presents as a return consult, referred by Hyacinth Meeker*, for evaluation and treatment of nasal congestion and nasal septal deviation. Per patient, she has had difficulty breathing, primarily through her right nasal cavity for the last several months. She was most recently seen in our office by Aquilla Hacker, PA on 04/16/2022, which time she had work-up to include nasal endoscopy and CT scanning. Both endoscopy and imaging revealed significant right septal deviation with bony spurring. There was no evidence of acute or chronic sinusitis.   Past Medical History:  Diagnosis Date   Anemia    Anxiety    Asthma    Depression     Past Surgical History:  Procedure Laterality Date   MULTIPLE TOOTH EXTRACTIONS  2013    History reviewed. No pertinent family history.  Social History:  reports that she has never smoked. She has never been exposed to tobacco smoke. She has never used smokeless tobacco. She reports current alcohol use of about 3.0 standard drinks of alcohol per week. She reports that she does not use drugs.  Allergies:  Allergies  Allergen Reactions   Lactose Intolerance (Gi)     Upset stomach    Lidocaine Viscous Hcl Nausea And Vomiting   Red Dye Nausea Only and Rash    Medications Prior to Admission  Medication Sig Dispense Refill   acetaminophen (TYLENOL) 325 MG tablet Take 650 mg by mouth every 6 (six) hours as needed for moderate pain.     buPROPion (WELLBUTRIN XL) 150 MG 24 hr tablet Take 150 mg by mouth every morning.     Cholecalciferol (VITAMIN D) 50 MCG (2000 UT) tablet Take 2,000 Units by mouth daily.     diphenhydrAMINE (BENADRYL) 25 MG tablet Take 25 mg by mouth every 6 (six) hours as needed for  allergies.     drospirenone-ethinyl estradiol (YAZ) 3-0.02 MG tablet Take 1 tablet by mouth daily.     famotidine (PEPCID) 20 MG tablet Take 20 mg by mouth at bedtime.     ferrous sulfate 325 (65 FE) MG tablet Take 650 mg by mouth daily.     ibuprofen (ADVIL) 200 MG tablet Take 400 mg by mouth every 6 (six) hours as needed for moderate pain.     lactase (LACTAID) 3000 units tablet Take 3,000 Units by mouth daily as needed (lactose intolerance).     Melatonin 5 MG CAPS Take 5 mg by mouth at bedtime as needed (sleep).     omeprazole (PRILOSEC OTC) 20 MG tablet Take 20 mg by mouth daily as needed (acid reflux).     Probiotic Product (PROBIOTIC PO) Take 1 capsule by mouth daily.     QUEtiapine (SEROQUEL) 100 MG tablet Take 100 mg by mouth at bedtime. Take with 50 mg to equal 150 mg at bedtime     QUEtiapine (SEROQUEL) 50 MG tablet Take 50 mg by mouth at bedtime. Take with 100 mg to equal 150 mg at bedtime     traZODone (DESYREL) 50 MG tablet Take 50 mg by mouth at bedtime.      No results found for this or any previous visit (from the past 48 hour(s)). No results found.  ROS: Review of Systems  All other systems reviewed and  are negative.   Blood pressure 112/76, pulse 77, temperature 98 F (36.7 C), temperature source Oral, resp. rate 17, height 5\' 4"  (1.626 m), weight 56.2 kg, last menstrual period 07/18/2022, SpO2 98 %.  PHYSICAL EXAM: Physical Exam Constitutional:      Appearance: Normal appearance.  HENT:     Head: Normocephalic.     Right Ear: External ear normal.     Left Ear: External ear normal.     Mouth/Throat:     Mouth: Mucous membranes are moist.  Pulmonary:     Effort: Pulmonary effort is normal.  Neurological:     General: No focal deficit present.     Mental Status: She is alert and oriented to person, place, and time.  Psychiatric:        Mood and Affect: Mood normal.        Behavior: Behavior normal.     Studies Reviewed: None   Assessment/Plan Audrey Callahan is a 21 y.o. female with long standing history of nasal congestion, right greater than left, unresponsive to conservative management.  -To OR today for septoplasty, bilateral inferior turbinate reduction and repair of nasal valve collapse under general anesthesia. Risks of surgery, benefits as well as expected postoperative course and recovery were reviewed comprehensively with patient, who expressed understanding and agreement. All questions answered.   Saul Dorsi A Khandi Kernes 08/28/2022, 10:40 AM

## 2022-08-28 NOTE — ED Provider Notes (Signed)
[facility] EMERGENCY DEPT Provider Note   CSN: [id number] Arrival date & time: 08/28/22  1929     History  Chief Complaint  Patient presents with   Emesis    Audrey Callahan is a 21 y.o. female.   Emesis Patient had nasal surgery today.  Done by Dr. Marene Lenz.  Be began to have nausea and vomiting along with cough after.  Reportedly coughs after the nausea and vomiting.  Unable to keep Zofran down and unable to keep pain pills down.  Reportedly started vomiting after leaving the hospital.  Does have some sore throat.  Upon arrival found to be hypoxic with sats in the 80s.     Home Medications Prior to Admission medications   Medication Sig Start Date End Date Taking? Authorizing Provider  acetaminophen (TYLENOL) 325 MG tablet Take 650 mg by mouth every 6 (six) hours as needed for moderate pain.    [provider]  buPROPion (WELLBUTRIN XL) 150 MG 24 hr tablet Take 150 mg by mouth every morning. 07/31/22   [provider]  Cholecalciferol (VITAMIN D) 50 MCG (2000 UT) tablet Take 2,000 Units by mouth daily.    [provider]  diphenhydrAMINE (BENADRYL) 25 MG tablet Take 25 mg by mouth every 6 (six) hours as needed for allergies.    [provider]  docusate sodium (COLACE) 100 MG capsule Take 1 capsule (100 mg total) by mouth 2 (two) times daily as needed for up to 10 days. 08/28/22 09/07/22  Skotnicki, Meghan A, DO  drospirenone-ethinyl estradiol (YAZ) 3-0.02 MG tablet Take 1 tablet by mouth daily. 06/24/22   [provider]  famotidine (PEPCID) 20 MG tablet Take 20 mg by mouth at bedtime. 08/11/22   [provider]  ferrous sulfate 325 (65 FE) MG tablet Take 650 mg by mouth daily.    [provider]  HYDROcodone-acetaminophen (NORCO/VICODIN) 5-325 MG tablet Take 1 tablet by mouth every 6 (six) hours as needed for up to 5 days for moderate pain. 08/28/22 09/02/22  Skotnicki, Meghan A, DO  ibuprofen (ADVIL) 200 MG  tablet Take 400 mg by mouth every 6 (six) hours as needed for moderate pain.    [provider]  lactase (LACTAID) 3000 units tablet Take 3,000 Units by mouth daily as needed (lactose intolerance).    [provider]  Melatonin 5 MG CAPS Take 5 mg by mouth at bedtime as needed (sleep).    [provider]  omeprazole (PRILOSEC OTC) 20 MG tablet Take 20 mg by mouth daily as needed (acid reflux).    [provider]  ondansetron (ZOFRAN) 4 MG tablet Take 1 tablet (4 mg total) by mouth every 8 (eight) hours as needed for up to 7 days for nausea or vomiting. 08/28/22 09/04/22  Skotnicki, Meghan A, DO  Probiotic Product (PROBIOTIC PO) Take 1 capsule by mouth daily.    [provider]  QUEtiapine (SEROQUEL) 100 MG tablet Take 100 mg by mouth at bedtime. Take with 50 mg to equal 150 mg at bedtime 06/19/22   [provider]  QUEtiapine (SEROQUEL) 50 MG tablet Take 50 mg by mouth at bedtime. Take with 100 mg to equal 150 mg at bedtime 08/08/22   [provider]  traZODone (DESYREL) 50 MG tablet Take 50 mg by mouth at bedtime. 07/25/22   [provider]      Allergies    Lactose intolerance (gi), Lidocaine viscous hcl, and Red dye    Review of Systems  Review of Systems  Gastrointestinal:  Positive for vomiting.    Physical Exam Updated Vital Signs BP 104/77   Pulse (!) 113   Temp 97.7 F (36.5 C) (Oral)   Resp (!) 22   LMP 07/18/2022 (Approximate)   SpO2 (!) 89%  Physical Exam Vitals reviewed.  HENT:     Nose:     Comments: Nose is packed and has dressing.  Posterior pharynx without edema. Cardiovascular:     Rate and Rhythm: Tachycardia present.  Pulmonary:     Breath sounds: No wheezing or rhonchi.     Comments: Tachypnea with somewhat harsh breath sounds. Skin:    General: Skin is warm.  Neurological:     Mental Status: She is alert and oriented to person, place, and time.     ED Results / Procedures / Treatments    Labs (all labs ordered are listed, but only abnormal results are displayed) Labs Reviewed  BASIC METABOLIC PANEL - Abnormal; Notable for the following components:      Result Value   Sodium 134 (*)    Glucose, Bld 168 (*)    Calcium 8.8 (*)    All other components within normal limits  CBC - Abnormal; Notable for the following components:   WBC 16.1 (*)    All other components within normal limits  SARS CORONAVIRUS 2 BY RT PCR  CULTURE, BLOOD (ROUTINE X 2)  CULTURE, BLOOD (ROUTINE X 2)  LACTIC ACID, PLASMA  I-STAT ARTERIAL BLOOD GAS, ED    EKG None  Radiology CT Angio Chest PE W and/or Wo Contrast  Result Date: 08/28/2022 CLINICAL DATA:  Vomiting after septoplasty; PE suspected high probability EXAM: CT ANGIOGRAPHY CHEST WITH CONTRAST TECHNIQUE: Multidetector CT imaging of the chest was performed using the standard protocol during bolus administration of intravenous contrast. Multiplanar CT image reconstructions and MIPs were obtained to evaluate the vascular anatomy. RADIATION DOSE REDUCTION: This exam was performed according to the departmental dose-optimization program which includes automated exposure control, adjustment of the mA and/or kV according to patient size and/or use of iterative reconstruction technique. CONTRAST:  13mL OMNIPAQUE IOHEXOL 350 MG/ML SOLN COMPARISON:  Radiographs earlier today FINDINGS: Cardiovascular: Satisfactory opacification of the pulmonary arteries to the segmental level. No evidence of pulmonary embolism. Normal heart size. No pericardial effusion. Mediastinum/Nodes: No enlarged mediastinal, hilar, or axillary lymph nodes. Thyroid gland, trachea, and esophagus demonstrate no significant findings. Lungs/Pleura: Patchy ground-glass opacities throughout the lungs which are more confluence in the bilateral lower lobes. The central airways are patent. No pleural effusion or pneumothorax Upper Abdomen: No acute abnormality. Musculoskeletal: No chest wall  abnormality. No acute osseous findings. Review of the MIP images confirms the above findings. IMPRESSION: 1. Patchy ground-glass opacities greatest in the lower lobes suggestive of pneumonia and/or aspiration. 2. Negative for acute pulmonary embolism. Electronically Signed   By: Minerva Fester M.D.   On: 08/28/2022 21:34   DG Chest Portable 1 View  Result Date: 08/28/2022 CLINICAL DATA:  Vomiting after septoplasty. EXAM: PORTABLE CHEST 1 VIEW COMPARISON:  None Available. FINDINGS: The heart size and mediastinal contours are within normal limits. Moderate severity diffuse bilateral infiltrates are noted. There is no evidence of a pleural effusion or pneumothorax. The visualized skeletal structures are unremarkable. IMPRESSION: Moderate severity diffuse bilateral infiltrates. Electronically Signed   By: Aram Candela M.D.   On: 08/28/2022 20:56    Procedures Procedures    Medications Ordered in ED Medications  fentaNYL (SUBLIMAZE) injection 100 mcg (has no administration  in time range)  ipratropium (ATROVENT) nebulizer solution 0.5 mg (has no administration in time range)  piperacillin-tazobactam (ZOSYN) IVPB 3.375 g (has no administration in time range)  azithromycin (ZITHROMAX) 500 mg in sodium chloride 0.9 % 250 mL IVPB (500 mg Intravenous New Bag/Given 08/28/22 2220)  sodium chloride 0.9 % bolus 1,000 mL (0 mLs Intravenous Stopped 08/28/22 2055)  prochlorperazine (COMPAZINE) injection 5 mg (5 mg Intravenous Given 08/28/22 1954)  iohexol (OMNIPAQUE) 350 MG/ML injection 60 mL (60 mLs Intravenous Contrast Given 08/28/22 2119)  albuterol (PROVENTIL) (2.5 MG/3ML) 0.083% nebulizer solution 5 mg (5 mg Nebulization Given 08/28/22 2225)  prochlorperazine (COMPAZINE) injection 5 mg (5 mg Intravenous Given 08/28/22 2138)    ED Course/ Medical Decision Making/ A&P                           Medical Decision Making Amount and/or Complexity of Data Reviewed Labs: ordered. Radiology:  ordered.  Risk Prescription drug management. Decision regarding hospitalization.   Patient presented with nausea vomiting and cough after surgery today.  However is mildly hypotensive and is persistently hypoxic.  Started on facemask and still sats in the upper 80s or may be low 90s.  Has occasional cough.  Compazine been given for the vomiting and Zofran not help.  Chest x-ray done and did show infiltrates.  Discussed with radiologist and CT scan recommended.  Discussed with Dr.Skotnicki about the surgery.  Reportedly surgery went well but did have an episode of bradycardia after epinephrine had been given.  Patient's mother states that the patient was hypoxic while in recovery but states she was told it was not accurate. White count mildly elevated.  Lactic acid normal.  Negative COVID test.  CT scan done and showed diffuse infiltrates.  Pneumonia versus aspiration.  Discussed with Dr. Judeth Horn from ICU.  Will admit patient.  Available bed at [facility] long.  Given Zosyn and azithromycin.  Difficulty obtaining ABG.  CRITICAL CARE Performed by: Benjiman Core Total critical care time: 40 minutes Critical care time was exclusive of separately billable procedures and treating other patients. Critical care was necessary to treat or prevent imminent or life-threatening deterioration. Critical care was time spent personally by me on the following activities: development of treatment plan with patient and/or surrogate as well as nursing, discussions with consultants, evaluation of patient's response to treatment, examination of patient, obtaining history from patient or surrogate, ordering and performing treatments and interventions, ordering and review of laboratory studies, ordering and review of radiographic studies, pulse oximetry and re-evaluation of patient's condition.         Final Clinical Impression(s) / ED Diagnoses Final diagnoses:  Pneumonia of both lungs due to infectious organism,  unspecified part of lung    Rx / DC Orders ED Discharge Orders     None         Benjiman Core, MD 08/28/22 2229

## 2022-08-28 NOTE — ED Notes (Signed)
RT attempted to stick for ABG twice. Unsuccessful attempts. EDP notified. Will attempt at later time.

## 2022-08-28 NOTE — Anesthesia Postprocedure Evaluation (Signed)
Anesthesia Post Note  Patient: Florean Hoobler  Procedure(s) Performed: NASAL SEPTOPLASTY WITH TURBINATE REDUCTION AND REPAIR OF NASAL VALVE COLLAPSE (Bilateral: Nose) EPISTAXIS CONTROL (Bilateral: Nose)     Patient location during evaluation: PACU Anesthesia Type: General Level of consciousness: awake and alert, oriented and patient cooperative Pain management: pain level controlled Vital Signs Assessment: post-procedure vital signs reviewed and stable Respiratory status: spontaneous breathing, nonlabored ventilation and respiratory function stable Cardiovascular status: blood pressure returned to baseline and stable Postop Assessment: no apparent nausea or vomiting Anesthetic complications: no Comments: Brief episode of bradycardia and then sustained tachycardia 2/2 intranasal injection of epinephrine which was likely somewhat intravascular. D/w pt mother that this is a rare occurrence but is a known and anticipated side effect of intranasal epinephrine.    No notable events documented.  Last Vitals:  Vitals:   08/28/22 1500 08/28/22 1515  BP: 112/83   Pulse: 98 (!) 103  Resp: 19 20  Temp:    SpO2: 92% (!) 76%    Last Pain:  Vitals:   08/28/22 1445  TempSrc:   PainSc: 0-No pain                 Lannie Fields

## 2022-08-28 NOTE — Discharge Instructions (Signed)
SEPTOPLASTY Post Operative Instructions  Office: [phone]  The Surgery Itself Septoplasty and turbinate reduction involves general anesthesia, typically for one to two hours. Patients may be sedated for several hours after surgery and may remain sleepy for the better part of the day. Nausea and vomiting are occasionally seen, and usually resolve by the evening of surgery - even without additional medications. Almost all patients can go home the day of surgery.  After Surgery  Facial pressure and fullness similar to a sinus infection/headache is normal after surgery. Breathing through your nose is also difficult due to swelling. A humidifier or vaporizer can be used in the bedroom to prevent throat pain with mouth breathing.   Bloody nasal drainage is normal after this surgery for 5-7 days, usually decreasing in volume with each day that passes. Drainage will flow from the front of the nose and down the back of the throat. Make sure you spit out blood drainage that drips down the back of your throat to prevent nausea/vomiting. You will have a nasal drip pad/sling with gauze to catch drainage from the front of your nose. The dressing may need to be changed frequently during the first 24 hours following surgery. In case of profuse nasal bleeding, you may apply ice to the bridge of the nose and pinch the nose just above the tip and hold for 10 minutes; if bleeding continues, contact the doctors office.   Frequent hot showers or saline nasal rinses (NeilMed) will help break up congestion and clear any clot or mucus that builds up within the nose after surgery. This can be started the day after surgery. You can use the nasal saline every two hours.    It is more comfortable to sleep with extra pillows or in a recliner for the first few days after surgery until the drainage begins to resolve.    Do not blow your nose for 2 weeks after surgery.   Avoid lifting > 10 lbs. and no vigorous exercise for  2 weeks after Surgery.   Avoid airplane travel for 2 weeks following sinus surgery; the cabin pressure changes can cause pain and swelling within the nose/sinuses.   Sense of smell and taste are often diminished for several weeks after surgery. There may be some tenderness or numbness in your upper front teeth, which is normal after surgery. You may express old clot, discolored mucus or very large nasal crusts from your nose for up to 3-4 weeks after surgery; depending on how frequently and how effectively you irrigate your nose with the saltwater spray.   You may have absorbable sutures inside of your nose after surgery that will slowly dissolve in 2-3 weeks. Be careful when clearing crusts from the nose since they may be attached to these sutures.  Medications  Pain medication can be used for pain as prescribed. Pain and pressure in the nose is expected after surgery. As the surgical site heals, pain will resolve over the course of a week. Pain medications can cause nausea, which can be prevented if you take them with food or milk.   You can use 2 nasal sprays after surgery: Afrin can be used up to 2 times a day for up to 5 days after surgery (best before bed) to reduce bloody drainage from the nose for the first few days after surgery. Saline/salt water spray can be used as often as you would like starting the day after surgery to prevent crusting inside of the nose.   Take  all of your routine medications as prescribed, unless told otherwise by your surgeon. Any medications that thin the blood should be avoided. This includes aspirin. Avoid aspirin-like products for the first 72 hours after surgery (Advil, Motrin, Excedrin, Alieve, Celebrex, Naprosyn), but you may use them as needed for pain after 72 hours.

## 2022-08-28 NOTE — ED Triage Notes (Signed)
Vomiting after septoplasty. Started about 4:30pm. Pain in nose and throat. Unable to keep zofran or pain medication down

## 2022-08-28 NOTE — Transfer of Care (Signed)
Immediate Anesthesia Transfer of Care Note  Patient: Moraima Burd  Procedure(s) Performed: NASAL SEPTOPLASTY WITH TURBINATE REDUCTION AND REPAIR OF NASAL VALVE COLLAPSE (Bilateral: Nose)  Patient Location: PACU  Anesthesia Type:General  Level of Consciousness: drowsy and patient cooperative  Airway & Oxygen Therapy: Patient Spontanous Breathing and Patient connected to face mask oxygen  Post-op Assessment: Report given to RN, Post -op Vital signs reviewed and stable and Patient moving all extremities X 4  Post vital signs: Reviewed and stable  Last Vitals:  Vitals Value Taken Time  BP    Temp    Pulse 95 08/28/22 1410  Resp 11 08/28/22 1410  SpO2 93 % 08/28/22 1410  Vitals shown include unvalidated device data.  Last Pain:  Vitals:   08/28/22 1054  TempSrc:   PainSc: 0-No pain      Patients Stated Pain Goal: 3 (08/28/22 1054)  Complications: No notable events documented.

## 2022-08-28 NOTE — ED Notes (Signed)
Patient's SpO2 80%-86% on Room Air. D/t patient's recent surgery, patient placed on 36% Face Tent at this time. SpO2 recovered to 92% following oxygen administration.

## 2022-08-29 ENCOUNTER — Inpatient Hospital Stay (HOSPITAL_COMMUNITY): Payer: BC Managed Care – PPO

## 2022-08-29 ENCOUNTER — Encounter (HOSPITAL_COMMUNITY): Payer: Self-pay | Admitting: Otolaryngology

## 2022-08-29 DIAGNOSIS — Y708 Miscellaneous anesthesiology devices associated with adverse incidents, not elsewhere classified: Secondary | ICD-10-CM | POA: Diagnosis present

## 2022-08-29 DIAGNOSIS — Z884 Allergy status to anesthetic agent status: Secondary | ICD-10-CM | POA: Diagnosis not present

## 2022-08-29 DIAGNOSIS — I248 Other forms of acute ischemic heart disease: Secondary | ICD-10-CM | POA: Diagnosis not present

## 2022-08-29 DIAGNOSIS — G928 Other toxic encephalopathy: Secondary | ICD-10-CM | POA: Diagnosis present

## 2022-08-29 DIAGNOSIS — J9589 Other postprocedural complications and disorders of respiratory system, not elsewhere classified: Secondary | ICD-10-CM | POA: Diagnosis present

## 2022-08-29 DIAGNOSIS — G4700 Insomnia, unspecified: Secondary | ICD-10-CM | POA: Diagnosis present

## 2022-08-29 DIAGNOSIS — R0600 Dyspnea, unspecified: Secondary | ICD-10-CM | POA: Diagnosis not present

## 2022-08-29 DIAGNOSIS — T8859XA Other complications of anesthesia, initial encounter: Secondary | ICD-10-CM | POA: Diagnosis present

## 2022-08-29 DIAGNOSIS — Z20822 Contact with and (suspected) exposure to covid-19: Secondary | ICD-10-CM | POA: Diagnosis present

## 2022-08-29 DIAGNOSIS — I428 Other cardiomyopathies: Secondary | ICD-10-CM | POA: Diagnosis present

## 2022-08-29 DIAGNOSIS — F32A Depression, unspecified: Secondary | ICD-10-CM | POA: Diagnosis present

## 2022-08-29 DIAGNOSIS — R570 Cardiogenic shock: Secondary | ICD-10-CM

## 2022-08-29 DIAGNOSIS — E739 Lactose intolerance, unspecified: Secondary | ICD-10-CM | POA: Diagnosis present

## 2022-08-29 DIAGNOSIS — J342 Deviated nasal septum: Secondary | ICD-10-CM | POA: Diagnosis present

## 2022-08-29 DIAGNOSIS — J343 Hypertrophy of nasal turbinates: Secondary | ICD-10-CM | POA: Diagnosis present

## 2022-08-29 DIAGNOSIS — J45909 Unspecified asthma, uncomplicated: Secondary | ICD-10-CM | POA: Diagnosis present

## 2022-08-29 DIAGNOSIS — Z91048 Other nonmedicinal substance allergy status: Secondary | ICD-10-CM | POA: Diagnosis not present

## 2022-08-29 DIAGNOSIS — J9601 Acute respiratory failure with hypoxia: Secondary | ICD-10-CM | POA: Diagnosis present

## 2022-08-29 DIAGNOSIS — I5021 Acute systolic (congestive) heart failure: Secondary | ICD-10-CM | POA: Diagnosis present

## 2022-08-29 DIAGNOSIS — T380X5A Adverse effect of glucocorticoids and synthetic analogues, initial encounter: Secondary | ICD-10-CM | POA: Diagnosis present

## 2022-08-29 DIAGNOSIS — R040 Epistaxis: Secondary | ICD-10-CM | POA: Diagnosis present

## 2022-08-29 DIAGNOSIS — I959 Hypotension, unspecified: Secondary | ICD-10-CM | POA: Diagnosis not present

## 2022-08-29 DIAGNOSIS — Y838 Other surgical procedures as the cause of abnormal reaction of the patient, or of later complication, without mention of misadventure at the time of the procedure: Secondary | ICD-10-CM | POA: Diagnosis present

## 2022-08-29 DIAGNOSIS — J189 Pneumonia, unspecified organism: Secondary | ICD-10-CM

## 2022-08-29 DIAGNOSIS — J690 Pneumonitis due to inhalation of food and vomit: Secondary | ICD-10-CM

## 2022-08-29 DIAGNOSIS — I420 Dilated cardiomyopathy: Secondary | ICD-10-CM | POA: Diagnosis present

## 2022-08-29 DIAGNOSIS — I5181 Takotsubo syndrome: Secondary | ICD-10-CM | POA: Diagnosis present

## 2022-08-29 DIAGNOSIS — K219 Gastro-esophageal reflux disease without esophagitis: Secondary | ICD-10-CM | POA: Diagnosis present

## 2022-08-29 LAB — CBC WITH DIFFERENTIAL/PLATELET
Abs Immature Granulocytes: 0.1 10*3/uL — ABNORMAL HIGH (ref 0.00–0.07)
Basophils Absolute: 0 10*3/uL (ref 0.0–0.1)
Basophils Relative: 0 %
Eosinophils Absolute: 0 10*3/uL (ref 0.0–0.5)
Eosinophils Relative: 0 %
HCT: 39.9 % (ref 36.0–46.0)
Hemoglobin: 13.5 g/dL (ref 12.0–15.0)
Immature Granulocytes: 1 %
Lymphocytes Relative: 6 %
Lymphs Abs: 1 10*3/uL (ref 0.7–4.0)
MCH: 29.9 pg (ref 26.0–34.0)
MCHC: 33.8 g/dL (ref 30.0–36.0)
MCV: 88.5 fL (ref 80.0–100.0)
Monocytes Absolute: 0.5 10*3/uL (ref 0.1–1.0)
Monocytes Relative: 3 %
Neutro Abs: 16 10*3/uL — ABNORMAL HIGH (ref 1.7–7.7)
Neutrophils Relative %: 90 %
Platelets: 309 10*3/uL (ref 150–400)
RBC: 4.51 MIL/uL (ref 3.87–5.11)
RDW: 12.5 % (ref 11.5–15.5)
WBC: 17.6 10*3/uL — ABNORMAL HIGH (ref 4.0–10.5)
nRBC: 0 % (ref 0.0–0.2)

## 2022-08-29 LAB — BRAIN NATRIURETIC PEPTIDE: B Natriuretic Peptide: 302.7 pg/mL — ABNORMAL HIGH (ref 0.0–100.0)

## 2022-08-29 LAB — BASIC METABOLIC PANEL
Anion gap: 6 (ref 5–15)
BUN: 10 mg/dL (ref 6–20)
CO2: 20 mmol/L — ABNORMAL LOW (ref 22–32)
Calcium: 8.4 mg/dL — ABNORMAL LOW (ref 8.9–10.3)
Chloride: 110 mmol/L (ref 98–111)
Creatinine, Ser: 0.57 mg/dL (ref 0.44–1.00)
GFR, Estimated: >60 mL/min (ref >60)
Glucose, Bld: 164 mg/dL — ABNORMAL HIGH (ref 70–99)
Potassium: 4 mmol/L (ref 3.5–5.1)
Sodium: 136 mmol/L (ref 135–145)

## 2022-08-29 LAB — ECHOCARDIOGRAM COMPLETE
AR max vel: 2.72 cm2
AV Area VTI: 2.64 cm2
AV Area mean vel: 2.68 cm2
AV Mean grad: 3 mmHg
AV Peak grad: 4.6 mmHg
Ao pk vel: 1.07 m/s
Area-P 1/2: 6.6 cm2
Calc EF: 35.9 %
Height: 64 in
MV M vel: 2.17 m/s
MV Peak grad: 18.8 mmHg
S' Lateral: 2.8 cm
Single Plane A2C EF: 34.6 %
Single Plane A4C EF: 38.4 %
Weight: 2088.2 oz

## 2022-08-29 LAB — LACTIC ACID, PLASMA
Lactic Acid, Venous: 1.2 mmol/L (ref 0.5–1.9)
Lactic Acid, Venous: 1.3 mmol/L (ref 0.5–1.9)

## 2022-08-29 LAB — URINALYSIS, ROUTINE W REFLEX MICROSCOPIC
Bilirubin Urine: NEGATIVE
Glucose, UA: 50 mg/dL — AB
Hgb urine dipstick: NEGATIVE
Ketones, ur: 5 mg/dL — AB
Nitrite: NEGATIVE
Protein, ur: NEGATIVE mg/dL
Specific Gravity, Urine: 1.021 (ref 1.005–1.030)
pH: 6 (ref 5.0–8.0)

## 2022-08-29 LAB — COOXEMETRY PANEL
Carboxyhemoglobin: 1.7 % — ABNORMAL HIGH (ref 0.5–1.5)
Methemoglobin: <0.7 % (ref 0.0–1.5)
O2 Saturation: 77.8 %
Total hemoglobin: 10.2 g/dL — ABNORMAL LOW (ref 12.0–16.0)

## 2022-08-29 LAB — PROCALCITONIN: Procalcitonin: <0.1 ng/mL

## 2022-08-29 LAB — C-REACTIVE PROTEIN: CRP: 1.6 mg/dL — ABNORMAL HIGH (ref <1.0)

## 2022-08-29 LAB — T4, FREE: Free T4: 1.29 ng/dL — ABNORMAL HIGH (ref 0.61–1.12)

## 2022-08-29 LAB — TSH: TSH: 5.775 u[IU]/mL — ABNORMAL HIGH (ref 0.350–4.500)

## 2022-08-29 LAB — HIV ANTIBODY (ROUTINE TESTING W REFLEX): HIV Screen 4th Generation wRfx: NONREACTIVE

## 2022-08-29 LAB — TROPONIN I (HIGH SENSITIVITY)
Troponin I (High Sensitivity): 818 ng/L (ref <18)
Troponin I (High Sensitivity): 866 ng/L (ref <18)

## 2022-08-29 LAB — MRSA NEXT GEN BY PCR, NASAL: MRSA by PCR Next Gen: NOT DETECTED

## 2022-08-29 LAB — PHOSPHORUS: Phosphorus: 3.3 mg/dL (ref 2.5–4.6)

## 2022-08-29 LAB — MAGNESIUM: Magnesium: 1.6 mg/dL — ABNORMAL LOW (ref 1.7–2.4)

## 2022-08-29 LAB — SEDIMENTATION RATE
Sed Rate: 0 mm/hr (ref 0–22)
Sed Rate: 0 mm/hr (ref 0–22)

## 2022-08-29 MED ORDER — FUROSEMIDE 10 MG/ML IJ SOLN: 40.0000 mg | Freq: Once | INTRAMUSCULAR | Status: DC

## 2022-08-29 MED ORDER — MORPHINE SULFATE (PF) 2 MG/ML IV SOLN: 2.0000 mg | INTRAVENOUS | Status: DC | PRN

## 2022-08-29 MED ORDER — ENOXAPARIN SODIUM 40 MG/0.4ML IJ SOSY
40.0000 mg | PREFILLED_SYRINGE | INTRAMUSCULAR | Status: DC
  Administered 2022-08-29 – 2022-08-31 (×3): 40 mg via SUBCUTANEOUS
  Filled 2022-08-29 (×4): qty 0.4

## 2022-08-29 MED ORDER — FENTANYL CITRATE PF 50 MCG/ML IJ SOSY
PREFILLED_SYRINGE | INTRAMUSCULAR | Status: AC
  Administered 2022-08-29: 25 ug via INTRAVENOUS
  Filled 2022-08-29: qty 1

## 2022-08-29 MED ORDER — BUPROPION HCL ER (XL) 150 MG PO TB24
150.0000 mg | ORAL_TABLET | Freq: Every morning | ORAL | Status: DC
  Administered 2022-08-29 – 2022-09-01 (×4): 150 mg via ORAL
  Filled 2022-08-29 (×4): qty 1

## 2022-08-29 MED ORDER — TRAZODONE HCL 50 MG PO TABS
50.0000 mg | ORAL_TABLET | Freq: Every day | ORAL | Status: DC
  Administered 2022-08-29 – 2022-08-31 (×3): 50 mg via ORAL
  Filled 2022-08-29 (×4): qty 1

## 2022-08-29 MED ORDER — LACTATED RINGERS IV SOLN: INTRAVENOUS | Status: DC

## 2022-08-29 MED ORDER — PERFLUTREN LIPID MICROSPHERE
1.0000 mL | INTRAVENOUS | Status: AC | PRN
  Administered 2022-08-29: 2 mL via INTRAVENOUS

## 2022-08-29 MED ORDER — SODIUM CHLORIDE 0.9 % IV SOLN: INTRAVENOUS | Status: DC | PRN

## 2022-08-29 MED ORDER — NOREPINEPHRINE 4 MG/250ML-% IV SOLN: 0.0000 ug/min | INTRAVENOUS | Status: DC

## 2022-08-29 MED ORDER — OXYCODONE HCL 5 MG PO TABS
5.0000 mg | ORAL_TABLET | Freq: Four times a day (QID) | ORAL | Status: DC | PRN
  Administered 2022-08-30: 5 mg via ORAL
  Filled 2022-08-29: qty 1

## 2022-08-29 MED ORDER — MIDAZOLAM HCL 2 MG/2ML IJ SOLN: 0.5000 mg | Freq: Once | INTRAMUSCULAR | Status: AC

## 2022-08-29 MED ORDER — FAMOTIDINE 20 MG PO TABS
20.0000 mg | ORAL_TABLET | Freq: Every day | ORAL | Status: DC
  Administered 2022-08-29 – 2022-08-31 (×3): 20 mg via ORAL
  Filled 2022-08-29 (×2): qty 1

## 2022-08-29 MED ORDER — MIDAZOLAM HCL 2 MG/2ML IJ SOLN
INTRAMUSCULAR | Status: AC
  Administered 2022-08-29: 0.5 mg via INTRAVENOUS
  Filled 2022-08-29: qty 2

## 2022-08-29 MED ORDER — LACTATED RINGERS IV SOLN: INTRAVENOUS | Status: AC

## 2022-08-29 MED ORDER — ORAL CARE MOUTH RINSE: 15.0000 mL | OROMUCOSAL | Status: DC | PRN

## 2022-08-29 MED ORDER — LACTATED RINGERS IV BOLUS
250.0000 mL | Freq: Once | INTRAVENOUS | Status: AC
  Administered 2022-08-29: 250 mL via INTRAVENOUS

## 2022-08-29 MED ORDER — MENTHOL 3 MG MT LOZG
1.0000 | LOZENGE | OROMUCOSAL | Status: DC | PRN
  Filled 2022-08-29: qty 9

## 2022-08-29 MED ORDER — LACTATED RINGERS IV BOLUS
500.0000 mL | Freq: Once | INTRAVENOUS | Status: AC
  Administered 2022-08-29: 500 mL via INTRAVENOUS

## 2022-08-29 MED ORDER — NOREPINEPHRINE 4 MG/250ML-% IV SOLN
2.0000 ug/min | INTRAVENOUS | Status: DC
  Administered 2022-08-29: 2 ug/min via INTRAVENOUS
  Filled 2022-08-29: qty 250

## 2022-08-29 MED ORDER — SODIUM CHLORIDE 0.9 % IV SOLN
250.0000 mL | INTRAVENOUS | Status: DC
  Administered 2022-08-29: 250 mL via INTRAVENOUS

## 2022-08-29 MED ORDER — ENOXAPARIN SODIUM 30 MG/0.3ML IJ SOSY: 30.0000 mg | PREFILLED_SYRINGE | INTRAMUSCULAR | Status: DC

## 2022-08-29 MED ORDER — PHENYLEPHRINE HCL-NACL 20-0.9 MG/250ML-% IV SOLN
25.0000 ug/min | INTRAVENOUS | Status: DC
  Administered 2022-08-29: 25 ug/min via INTRAVENOUS
  Filled 2022-08-29: qty 250

## 2022-08-29 MED ORDER — POLYETHYLENE GLYCOL 3350 17 G PO PACK: 17.0000 g | PACK | Freq: Every day | ORAL | Status: DC | PRN

## 2022-08-29 MED ORDER — ONDANSETRON HCL 4 MG/2ML IJ SOLN
4.0000 mg | Freq: Four times a day (QID) | INTRAMUSCULAR | Status: DC | PRN
  Administered 2022-08-29: 4 mg via INTRAVENOUS
  Filled 2022-08-29 (×2): qty 2

## 2022-08-29 MED ORDER — ASPIRIN 81 MG PO CHEW
81.0000 mg | CHEWABLE_TABLET | Freq: Every day | ORAL | Status: DC
  Administered 2022-08-29 – 2022-08-30 (×2): 81 mg via ORAL
  Filled 2022-08-29 (×2): qty 1

## 2022-08-29 MED ORDER — MAGNESIUM SULFATE 4 GM/100ML IV SOLN
4.0000 g | Freq: Once | INTRAVENOUS | Status: AC
  Administered 2022-08-29: 4 g via INTRAVENOUS
  Filled 2022-08-29: qty 100

## 2022-08-29 MED ORDER — MORPHINE SULFATE (PF) 4 MG/ML IV SOLN: 4.0000 mg | INTRAVENOUS | Status: DC | PRN

## 2022-08-29 MED ORDER — SODIUM CHLORIDE 0.9 % IV SOLN
500.0000 mg | INTRAVENOUS | Status: DC
  Administered 2022-08-29 – 2022-08-30 (×2): 500 mg via INTRAVENOUS
  Filled 2022-08-29 (×2): qty 5

## 2022-08-29 MED ORDER — FENTANYL CITRATE PF 50 MCG/ML IJ SOSY: 25.0000 ug | PREFILLED_SYRINGE | Freq: Once | INTRAMUSCULAR | Status: AC

## 2022-08-29 MED ORDER — METHYLPREDNISOLONE SODIUM SUCC 125 MG IJ SOLR
60.0000 mg | Freq: Every day | INTRAMUSCULAR | Status: DC
  Administered 2022-08-29 – 2022-08-30 (×2): 60 mg via INTRAVENOUS
  Filled 2022-08-29 (×2): qty 2

## 2022-08-29 MED ORDER — ACETAMINOPHEN 325 MG PO TABS
650.0000 mg | ORAL_TABLET | Freq: Four times a day (QID) | ORAL | Status: DC | PRN
  Administered 2022-08-29 – 2022-09-01 (×8): 650 mg via ORAL
  Filled 2022-08-29 (×8): qty 2

## 2022-08-29 MED ORDER — PIPERACILLIN-TAZOBACTAM 3.375 G IVPB
3.3750 g | Freq: Three times a day (TID) | INTRAVENOUS | Status: DC
  Administered 2022-08-29 – 2022-08-31 (×7): 3.375 g via INTRAVENOUS
  Filled 2022-08-29 (×7): qty 50

## 2022-08-29 NOTE — Consult Note (Addendum)
Advanced Heart Failure Team Consult Note   Primary Physician: Soundra Pilon, FNP PCP-Cardiologist:  None  Reason for Consultation: Cardiogenic shock  HPI:    Audrey Callahan is seen today for evaluation of suspected cardiogenic shock at the request of Dr. Mayford Knife with [facility]. 21 y.o. female with history of anxiety and depression.   She underwent septoplasty on 08/28/22. Once she got home she developed nausea and vomiting. She presented to the ED yesterday evening for further evaluation. She was hypoxic and had had bilateral diffuse infiltrates on CXR and CT concerning for ARDS from aspiration PNA. No evidence of PE on imaging. She was admitted to the ICU and started on IV abx. Lactic acid 1.8.   Echo today: EF 35-40%, RV okay, no significant valvular disease, IVC not dilated  Cardiology consulted. Throughout today she has becomeprogressively hypotensive and tachycardic.  Labs today significant for: CRP 1.6, procalcitonin < 0.10, ESR 0, Scr 0.57, CO2 20, K 4.0, Na 136, Mag 1.6, BNP 302, Hgb 13.5, WBC 17.6 (received steroids), repeat HS troponin and lactic acid pending. Appeared volume overloaded. Central line placed by CCM. She was started on pressor support with phenylephrine and norepinephrine. Due to concern for cardiogenic shock she was transferred to [facility] for Advanced Heart Failure evaluation.    Review of Systems: [y] = yes, [ ]  = no   General: Weight gain [ ] ; Weight loss [ ] ; Anorexia [ ] ; Fatigue [Y ]; Fever [ ] ; Chills [ ] ; Weakness [ Y]  Cardiac: Chest pain/pressure [ ] ; Resting SOB [ Y]; Exertional SOB [ ] ; Orthopnea [ ] ; Pedal Edema [ ] ; Palpitations [ ] ; Syncope [ ] ; Presyncope [ ] ; Paroxysmal nocturnal dyspnea[ ]   Pulmonary: Cough [ ] ; Wheezing[ ] ; Hemoptysis[ ] ; Sputum [ ] ; Snoring [ ]   GI: Vomiting[Y ]; Dysphagia[ ] ; Melena[ ] ; Hematochezia [ ] ; Heartburn[ ] ; Abdominal pain [ ] ; Constipation [ ] ; Diarrhea [ ] ; BRBPR [ ]   GU: Hematuria[ ] ; Dysuria [ ] ; Nocturia[  ]  Vascular: Pain in legs with walking [ ] ; Pain in feet with lying flat [ ] ; Non-healing sores [ ] ; Stroke [ ] ; TIA [ ] ; Slurred speech [ ] ;  Neuro: Headaches[ ] ; Vertigo[ ] ; Seizures[ ] ; Paresthesias[ ] ;Blurred vision [ ] ; Diplopia [ ] ; Vision changes [ ]   Ortho/Skin: Arthritis [ ] ; Joint pain [ ] ; Muscle pain [ ] ; Joint swelling [ ] ; Back Pain [ ] ; Rash [ ]   Psych: Depression[Y]; Anxiety[Y]  Heme: Bleeding problems [ ] ; Clotting disorders [ ] ; Anemia [ ]   Endocrine: Diabetes [ ] ; Thyroid dysfunction[ ]   Home Medications Prior to Admission medications   Medication Sig Start Date End Date Taking? Authorizing Provider  acetaminophen (TYLENOL) 325 MG tablet Take 650 mg by mouth every 6 (six) hours as needed for moderate pain.   Yes [provider]  buPROPion (WELLBUTRIN XL) 150 MG 24 hr tablet Take 150 mg by mouth every morning. 07/31/22  Yes [provider]  Cholecalciferol (VITAMIN D) 50 MCG (2000 UT) tablet Take 2,000 Units by mouth daily.   Yes [provider]  diphenhydrAMINE (BENADRYL) 25 MG tablet Take 25 mg by mouth every 6 (six) hours as needed for allergies.   Yes [provider]  docusate sodium (COLACE) 100 MG capsule Take 1 capsule (100 mg total) by mouth 2 (two) times daily as needed for up to 10 days. Patient taking differently: Take 100 mg by mouth 2 (two) times daily as needed for mild constipation.  08/28/22 09/07/22 Yes Skotnicki, Meghan A, DO  drospirenone-ethinyl estradiol (YAZ) 3-0.02 MG tablet Take 1 tablet by mouth daily. 06/24/22  Yes [provider]  famotidine (PEPCID) 20 MG tablet Take 20 mg by mouth at bedtime. 08/11/22  Yes [provider]  ferrous sulfate 325 (65 FE) MG tablet Take 325 mg by mouth daily.   Yes [provider]  HYDROcodone-acetaminophen (NORCO/VICODIN) 5-325 MG tablet Take 1 tablet by mouth every 6 (six) hours as needed for up to 5 days for moderate pain. 08/28/22 09/02/22 Yes Skotnicki, Meghan A,  DO  ibuprofen (ADVIL) 200 MG tablet Take 400 mg by mouth every 6 (six) hours as needed for moderate pain.   Yes [provider]  lactase (LACTAID) 3000 units tablet Take 3,000 Units by mouth daily as needed (lactose intolerance).   Yes [provider]  Melatonin 5 MG CAPS Take 5 mg by mouth at bedtime as needed (sleep).   Yes [provider]  omeprazole (PRILOSEC OTC) 20 MG tablet Take 20 mg by mouth daily as needed (acid reflux).   Yes [provider]  ondansetron (ZOFRAN) 4 MG tablet Take 1 tablet (4 mg total) by mouth every 8 (eight) hours as needed for up to 7 days for nausea or vomiting. 08/28/22 09/04/22 Yes Skotnicki, Meghan A, DO  Probiotic Product (PROBIOTIC PO) Take 1 capsule by mouth daily.   Yes [provider]  QUEtiapine (SEROQUEL) 100 MG tablet Take 100 mg by mouth at bedtime. Take with 50 mg to equal 150 mg at bedtime 06/19/22  Yes [provider]  QUEtiapine (SEROQUEL) 50 MG tablet Take 50 mg by mouth at bedtime. Take with 100 mg to equal 150 mg at bedtime 08/08/22  Yes [provider]  traZODone (DESYREL) 50 MG tablet Take 50 mg by mouth at bedtime. 07/25/22  Yes [provider]    Past Medical History: Past Medical History:  Diagnosis Date   Anemia    Anxiety    Asthma    Depression     Past Surgical History: Past Surgical History:  Procedure Laterality Date   MULTIPLE TOOTH EXTRACTIONS  2013   NASAL HEMORRHAGE CONTROL Bilateral 08/28/2022   Procedure: EPISTAXIS CONTROL;  Surgeon: Jason Coop, DO;  Location: [place];  Service: ENT;  Laterality: Bilateral;   NASAL SEPTOPLASTY W/ TURBINOPLASTY Bilateral 08/28/2022   Procedure: NASAL SEPTOPLASTY WITH TURBINATE REDUCTION AND REPAIR OF NASAL VALVE COLLAPSE;  Surgeon: Jason Coop, DO;  Location: [place];  Service: ENT;  Laterality: Bilateral;   NASAL SEPTUM SURGERY  08/28/2022    Family History: Family History  Adopted: Yes    Social  History: Social History   Socioeconomic History   Marital status: Single    Spouse name: Not on file   Number of children: Not on file   Years of education: Not on file   Highest education level: Not on file  Occupational History   Not on file  Tobacco Use   Smoking status: Never    Passive exposure: Never   Smokeless tobacco: Never  Vaping Use   Vaping Use: Never used  Substance and Sexual Activity   Alcohol use: Yes    Alcohol/week: 3.0 standard drinks of alcohol    Types: 3 Glasses of wine per week    Comment: pt reports 2-3 drinks twice a month   Drug use: Never   Sexual activity: Yes    Birth control/protection: Pill  Other Topics Concern   Not on  file  Social History Narrative   Not on file   Social Determinants of Health   Financial Resource Strain: Not on file  Food Insecurity: Not on file  Transportation Needs: Not on file  Physical Activity: Not on file  Stress: Not on file  Social Connections: Not on file    Allergies:  Allergies  Allergen Reactions   Lactose Intolerance (Gi)     Upset stomach    Lidocaine Viscous Hcl Nausea And Vomiting   Red Dye Nausea Only and Rash    Objective:    Vital Signs:   Temp:  [97 F (36.1 C)-98.4 F (36.9 C)] 98.4 F (36.9 C) (09/14 0411) Pulse Rate:  [87-122] 110 (09/14 1320) Resp:  [18-32] 29 (09/14 1320) BP: (74-106)/(21-84) 83/47 (09/14 1320) SpO2:  [80 %-100 %] 99 % (09/14 1320) FiO2 (%):  [36 %-60 %] 60 % (09/14 0030) Weight:  [59.2 kg] 59.2 kg (09/14 0500) Last BM Date :  (PTA)  Weight change: Filed Weights   08/29/22 0030 08/29/22 0500  Weight: 59.2 kg 59.2 kg    Intake/Output:   Intake/Output Summary (Last 24 hours) at 08/29/2022 1501 Last data filed at 08/29/2022 1300 Gross per 24 hour  Intake 1937.06 ml  Output 2150 ml  Net -212.94 ml      Physical Exam    General:  Critically ill appearing HEENT: normal Neck: supple. JVP not elevated. Carotids 2+ bilat; no bruits. R IJ CVC Cor:  PMI nondisplaced. Regular rate & rhythm, tachycardic. No rubs, gallops or murmurs. Lungs: coarse Abdomen: soft, nontender, nondistended.  Extremities: no cyanosis, clubbing, rash, edema Neuro: alert & orientedx3, cranial nerves grossly intact. moves all 4 extremities w/o difficulty. Affect pleasant   Telemetry   Sinus tach  EKG    Sinus tach 105 bpm  Labs   Basic Metabolic Panel: Recent Labs  Lab 08/28/22 1949 08/29/22 0115  NA 134* 136  K 4.1 4.0  CL 100 110  CO2 22 20*  GLUCOSE 168* 164*  BUN 13 10  CREATININE 0.62 0.57  CALCIUM 8.8* 8.4*  MG  --  1.6*  PHOS  --  3.3    Liver Function Tests: No results for input(s): "AST", "ALT", "ALKPHOS", "BILITOT", "PROT", "ALBUMIN" in the last 168 hours. No results for input(s): "LIPASE", "AMYLASE" in the last 168 hours. No results for input(s): "AMMONIA" in the last 168 hours.  CBC: Recent Labs  Lab 08/28/22 1132 08/28/22 1949 08/29/22 0115  WBC 5.5 16.1* 17.6*  NEUTROABS  --   --  16.0*  HGB 13.5 13.7 13.5  HCT 39.8 39.8 39.9  MCV 87.1 85.6 88.5  PLT 298 326 309    Cardiac Enzymes: No results for input(s): "CKTOTAL", "CKMB", "CKMBINDEX", "TROPONINI" in the last 168 hours.  BNP: BNP (last 3 results) Recent Labs    08/29/22 0115  BNP 302.7*    ProBNP (last 3 results) No results for input(s): "PROBNP" in the last 8760 hours.   CBG: No results for input(s): "GLUCAP" in the last 168 hours.  Coagulation Studies: No results for input(s): "LABPROT", "INR" in the last 72 hours.   Imaging   ECHOCARDIOGRAM COMPLETE  Result Date: 08/29/2022    ECHOCARDIOGRAM REPORT   Patient Name:   KALAYLA SHADDEN Date of Exam: 08/29/2022 Medical Rec #:  [id number]      Height:       64.0 in Accession #:    [id number]     Weight:       130.5 lb  Date of Birth:  [birth year]      BSA:          1.632 m Patient Age:    21 years       BP:           90/61 mmHg Patient Gender: F              HR:           119 bpm. Exam Location:   Inpatient Procedure: 2D Echo and Intracardiac Opacification Agent Indications:    Dyspnea  History:        Patient has no prior history of Echocardiogram examinations.  Sonographer:    Harvie Junior Referring Phys: [phone] MATTHEW R HUNSUCKER  Sonographer Comments: Technically difficult study due to poor echo windows and no subcostal window. Image acquisition challenging due to respiratory motion and sitting position. IMPRESSIONS  1. No mural thrombus by Definity contrast. Left ventricular ejection fraction, by estimation, is 35 to 40%. Left ventricular ejection fraction by 2D MOD biplane is 35.9 %. The left ventricle has moderately decreased function. The left ventricle demonstrates global hypokinesis. Indeterminate diastolic filling due to E-A fusion.  2. Right ventricular systolic function is hyperdynamic. The right ventricular size is normal. Tricuspid regurgitation signal is inadequate for assessing PA pressure.  3. The mitral valve is grossly normal. Trivial mitral valve regurgitation.  4. The aortic valve is tricuspid. Aortic valve regurgitation is not visualized.  5. The inferior vena cava is normal in size with greater than 50% respiratory variability, suggesting right atrial pressure of 3 mmHg. Comparison(s): No prior Echocardiogram. FINDINGS  Left Ventricle: No mural thrombus by Definity contrast. Left ventricular ejection fraction, by estimation, is 35 to 40%. Left ventricular ejection fraction by 2D MOD biplane is 35.9 %. The left ventricle has moderately decreased function. The left ventricle demonstrates global hypokinesis. Definity contrast agent was given IV to delineate the left ventricular endocardial borders. The left ventricular internal cavity size was normal in size. There is no left ventricular hypertrophy. Indeterminate diastolic filling due to E-A fusion. Right Ventricle: The right ventricular size is normal. No increase in right ventricular wall thickness. Right ventricular systolic function  is hyperdynamic. Tricuspid regurgitation signal is inadequate for assessing PA pressure. Left Atrium: Left atrial size was normal in size. Right Atrium: Right atrial size was normal in size. Pericardium: There is no evidence of pericardial effusion. Mitral Valve: The mitral valve is grossly normal. Trivial mitral valve regurgitation. Tricuspid Valve: The tricuspid valve is grossly normal. Tricuspid valve regurgitation is trivial. Aortic Valve: The aortic valve is tricuspid. Aortic valve regurgitation is not visualized. Aortic valve mean gradient measures 3.0 mmHg. Aortic valve peak gradient measures 4.6 mmHg. Aortic valve area, by VTI measures 2.64 cm. Pulmonic Valve: The pulmonic valve was normal in structure. Pulmonic valve regurgitation is not visualized. Aorta: The aortic root and ascending aorta are structurally normal, with no evidence of dilitation. Venous: The inferior vena cava is normal in size with greater than 50% respiratory variability, suggesting right atrial pressure of 3 mmHg. IAS/Shunts: No atrial level shunt detected by color flow Doppler.  LEFT VENTRICLE PLAX 2D                        Biplane EF (MOD) LVIDd:         4.10 cm         LV Biplane EF:   Left LVIDs:         2.80 cm  ventricular LV PW:         0.60 cm                          ejection LV IVS:        0.60 cm                          fraction by LVOT diam:     2.00 cm                          2D MOD LV SV:         47                               biplane is LV SV Index:   29                               35.9 %. LVOT Area:     3.14 cm                                Diastology                                LV e' medial:    8.38 cm/s LV Volumes (MOD)               LV E/e' medial:  9.9 LV vol d, MOD    58.7 ml       LV e' lateral:   11.40 cm/s A2C:                           LV E/e' lateral: 7.3 LV vol d, MOD    62.0 ml A4C: LV vol s, MOD    38.4 ml A2C: LV vol s, MOD    38.2 ml A4C: LV SV MOD A2C:   20.3 ml LV SV  MOD A4C:   62.0 ml LV SV MOD BP:    21.7 ml RIGHT VENTRICLE RV S prime:     17.40 cm/s TAPSE (M-mode): 2.2 cm LEFT ATRIUM             Index       RIGHT ATRIUM          Index LA diam:        2.10 cm 1.29 cm/m  RA Area:     6.30 cm LA Vol (A2C):   13.6 ml 8.33 ml/m  RA Volume:   9.96 ml  6.10 ml/m LA Vol (A4C):   15.5 ml 9.50 ml/m LA Biplane Vol: 14.7 ml 9.01 ml/m  AORTIC VALVE                    PULMONIC VALVE AV Area (Vmax):    2.72 cm     PV Vmax:       0.90 m/s AV Area (Vmean):   2.68 cm     PV Peak grad:  3.2 mmHg AV Area (VTI):     2.64 cm AV Vmax:           107.00 cm/s AV Vmean:  74.700 cm/s AV VTI:            0.177 m AV Peak Grad:      4.6 mmHg AV Mean Grad:      3.0 mmHg LVOT Vmax:         92.50 cm/s LVOT Vmean:        63.800 cm/s LVOT VTI:          0.149 m LVOT/AV VTI ratio: 0.84  AORTA Ao Root diam: 3.00 cm Ao Asc diam:  2.60 cm MITRAL VALVE MV Area (PHT): 6.60 cm    SHUNTS MV Decel Time: 115 msec    Systemic VTI:  0.15 m MR Peak grad: 18.8 mmHg    Systemic Diam: 2.00 cm MR Vmax:      217.00 cm/s MV E velocity: 83.10 cm/s MV A velocity: 23.60 cm/s MV E/A ratio:  3.52 Zoila Shutter MD Electronically signed by Zoila Shutter MD Signature Date/Time: 08/29/2022/11:43:00 AM    Final    CT Angio Chest PE W and/or Wo Contrast  Result Date: 08/28/2022 CLINICAL DATA:  Vomiting after septoplasty; PE suspected high probability EXAM: CT ANGIOGRAPHY CHEST WITH CONTRAST TECHNIQUE: Multidetector CT imaging of the chest was performed using the standard protocol during bolus administration of intravenous contrast. Multiplanar CT image reconstructions and MIPs were obtained to evaluate the vascular anatomy. RADIATION DOSE REDUCTION: This exam was performed according to the departmental dose-optimization program which includes automated exposure control, adjustment of the mA and/or kV according to patient size and/or use of iterative reconstruction technique. CONTRAST:  76mL OMNIPAQUE IOHEXOL 350 MG/ML  SOLN COMPARISON:  Radiographs earlier today FINDINGS: Cardiovascular: Satisfactory opacification of the pulmonary arteries to the segmental level. No evidence of pulmonary embolism. Normal heart size. No pericardial effusion. Mediastinum/Nodes: No enlarged mediastinal, hilar, or axillary lymph nodes. Thyroid gland, trachea, and esophagus demonstrate no significant findings. Lungs/Pleura: Patchy ground-glass opacities throughout the lungs which are more confluence in the bilateral lower lobes. The central airways are patent. No pleural effusion or pneumothorax Upper Abdomen: No acute abnormality. Musculoskeletal: No chest wall abnormality. No acute osseous findings. Review of the MIP images confirms the above findings. IMPRESSION: 1. Patchy ground-glass opacities greatest in the lower lobes suggestive of pneumonia and/or aspiration. 2. Negative for acute pulmonary embolism. Electronically Signed   By: Minerva Fester M.D.   On: 08/28/2022 21:34   DG Chest Portable 1 View  Result Date: 08/28/2022 CLINICAL DATA:  Vomiting after septoplasty. EXAM: PORTABLE CHEST 1 VIEW COMPARISON:  None Available. FINDINGS: The heart size and mediastinal contours are within normal limits. Moderate severity diffuse bilateral infiltrates are noted. There is no evidence of a pleural effusion or pneumothorax. The visualized skeletal structures are unremarkable. IMPRESSION: Moderate severity diffuse bilateral infiltrates. Electronically Signed   By: Aram Candela M.D.   On: 08/28/2022 20:56     Medications:     Current Medications:  buPROPion  150 mg Oral q morning   Chlorhexidine Gluconate Cloth  6 each Topical Daily   enoxaparin (LOVENOX) injection  40 mg Subcutaneous Q24H   famotidine  20 mg Oral QHS   methylPREDNISolone (SOLU-MEDROL) injection  60 mg Intravenous Daily   mouth rinse  15 mL Mouth Rinse 4 times per day   traZODone  50 mg Oral QHS    Infusions:  sodium chloride     azithromycin     lactated ringers  75 mL/hr at 08/29/22 1353   phenylephrine (NEO-SYNEPHRINE) Adult infusion 25 mcg/min (08/29/22 1452)   piperacillin-tazobactam (ZOSYN)  IV 3.375 g (08/29/22 1352)      Patient Profile   21 yo female with history of anxiety and depression. No prior cardiac history. Presented with nausea, vomiting, acute hypoxemic respiratory failure after septoplasty on 08/28/22. Subsequently developed shock.  Assessment/Plan   Shock -Possibly cardiogenic from ? Stress cardiomyopathy in setting of possible aspiration PNA vs vasodilatory -Echo EF 35-40%, RV hyperdynamic, no significant valvular disease, IVC not dilated -Lactic acid 1.8, repeat pending -Trending HS troponin pending. ESR 0 which would go against myocarditis. -Procalcitonin < 0.10. WBC 5.5>16>17.6 but also received IV steroids. CCM currently covering with IV abx but less concern for septic shock -Now on phenylephrine 25 and norepinephrine 2 for pressor support.  -Follow CVP and CO-OX  2. 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 Acute respiratory failure with hypoxia/ARDS -N/V post anesthesia.  -Imaging with bilateral infiltrates concerning for ARDS vs aspiration PNA -CCM covering with empiric abx -No evidence of PE on CTA  4. Elevated TSH -TSH 5.7, Free T4 and T3 pending -Possibly sick euthryoid  Length of Stay: 0  FINCH, LINDSAY N, PA-C  08/29/2022, 3:01 PM  Advanced Heart Failure Team Pager [phone] (M-F; 7a - 5p)  Please contact [facility] for night-coverage after hours (4p -7a ) and weekends on [email or web address]   Patient seen with PA, agree with the above note.   History as noted above. She had no significant PMH prior to this event other than deviated septum.  Patient had repair of deviated septum on 9/13.  She developed severe nausea/vomiting after the procedure.  She began to cough and get short of breath, coughed up blood.  She was instructed to go to the ER.  In  the ER, she was noted to be hypoxemic. CTA chest showed no PE, lower lobe infiltrates, multifocal.  Concern for aspiration PNA, Zosyn started.  She then had an echo showing EF 35-40%, normal RV, trivial MR, IVC not dilated.   She developed hypotension with SBP to 80s during the day today, was started on NE at 2, SBP up to 100s.  RIJ placed, CVP 5.  PCT < 0.1, initial lactate normal. WBCs 17.6 but getting Solumedrol.   No prior history of heart disease, and she was adopted and family history of unknown.  Mother notes that her HR increased to 150s with walks at times this summer.  She denies ETOH or drugs. Has never been pregnant.   General: NAD Neck: No JVD, no thyromegaly or thyroid nodule.  Lungs: Clear to auscultation bilaterally with normal respiratory effort. CV: Nondisplaced PMI.  Heart regular S1/S2, no S3/S4, no murmur.  No peripheral edema.  No carotid bruit.  Normal pedal pulses.  Abdomen: Soft, nontender, no hepatosplenomegaly, no distention.  Skin: Intact without lesions or rashes.  Neurologic: Alert and oriented x 3.  Psych: Normal affect. Extremities: No clubbing or cyanosis.  HEENT: Normal.   1. Shock: Echo showed EF 35-40%, normal RV, trivial MR, IVC not dilated.  However, EF is not markedly low.  I wonder if this is not some form of vasodilatory shock related to aspiration event.  BP excellent currently on low dose norepinephrine. PCT < 0.1, septic shock seems unlikely.  - Continue current NE, wean off as able. Keep SBP 90+.   - She is on Zosyn though as noted above septic shock seems unlikely.  - CVP low 2.  Acute systolic CHF: Echo this admission with EF 35-40%, normal RV, trivial MR,  IVC not dilated. BNP 303.  No cardiac history, though mother notes tachycardia with walking this summer.  She has not history of exercise intolerance.  Family history unknown (adopted).  No ETOH/drugs reported.  Has never been pregnant.  CVP 5 today, not volume overloaded.  Cause of cardiomyopathy  uncertain => could this have been pre-existing vs stress cardiomyopathy (perhaps most likely) vs ?myocarditis/other.  Myocarditis seems like it would be an unlikely coincidence in this situation.  Hs-TnI returned at 818, ?demand ischemia with hypotension versus myocarditis.   - As above, wean NE as able.  - Cardiac MRI tomorrow to look for evidence for myocarditis or infiltrative disease.  - GDMT when BP stabilizes.  3. Elevated HS-TnI: HS-TnI 818 today in setting of hypotension through the day.  Possible demand ischemia related to hypotension or stress cardiomyopathy.  Also consider myocardial damage from myocarditis.  No chest pain and ECG is normal besides sinus tachy, doubt ACS.  - ASA 81 for now.  - Continue Lovenox prophylaxis for now.  - Trend troponin.  4. Acute hypoxemic respiratory failure: Hypoxemic yesterday.  CTA chest with no PE, multifocal lower lung field infiltrates.  Suspect most likely aspiration pneumonitis related to post-op nausea/vomiting.  CVP low, does not appear volume overloaded.  Cannot rule out flash pulmonary edema with rapid clearance.  EF low on echo but no MR. She is now back on room air.  - Continue Zosyn for aspiration PNA coverage.  - Continue PPI.   Loralie Champagne 08/29/2022 4:56 PM

## 2022-08-29 NOTE — Progress Notes (Signed)
NAME:  Audrey Callahan, MRN:  [id number], DOB:  [birth year], LOS: 0 ADMISSION DATE:  08/28/2022, CONSULTATION DATE:  08/29/22 REFERRING MD:  ED doctor, CHIEF COMPLAINT:  nausea   History of Present Illness:  21 year old woman who underwent septoplasty 08/28/2022 presents to the ED with nausea vomiting and shortness of breath found to have hypoxemic respiratory failure with bilateral diffuse infiltrates on chest x-ray and CT scan concerning for ARDS due to aspiration pneumonia.  History difficult given mild encephalopathy presumably lingering effect from anesthesia.  History from patient and mother.  Prior to surgery she was fine for the respiratory issues.  No shortness of breath or dyspnea exertion.  No hypoxemic respiratory failure, hypoxemia.  After the surgery she is been very nauseated.  Vomiting at home.  Oral Zofran never helpful.  Presented to the ED.  Found to be hypoxemic.  Chest x-ray on my review interpretation reveals bilateral infiltrates concerning for multifocal pneumonia.  CTA PE protocol was obtained which showed no PE but did demonstrate diffuse nodular with more confluent infiltrates in the lower lobes with predilection for dependent areas concerning for multifocal pneumonia versus ARDS.  After discussion with ED doctor antibiotics were instituted.  She is wearing a bucket facemask given recent nasal surgery and packing.  Pertinent  Medical History  GERD, depression, insomnia  Significant Hospital Events: Including procedures, antibiotic start and stop dates in addition to other pertinent events   9/13 presents to ED 9/14 admitted to ICU with acute hypoxic respite failure related to aspiration pneumonia and ARDS.   Interim History / Subjective:  Seen sitting up in bed alert and oriented x3,   Objective   Blood pressure (!) 85/50, pulse 91, temperature 98.4 F (36.9 C), temperature source Axillary, resp. rate (!) 25, height 5\' 4"  (1.626 m), weight 59.2 kg, last menstrual period  07/18/2022, SpO2 100 %.    FiO2 (%):  [36 %-60 %] 60 %   Intake/Output Summary (Last 24 hours) at 08/29/2022 0715 Last data filed at 08/29/2022 0600 Gross per 24 hour  Intake 1394.64 ml  Output --  Net 1394.64 ml    Filed Weights   08/29/22 0030 08/29/22 0500  Weight: 59.2 kg 59.2 kg    Examination: General: Acute ill appearing adult female sitting up in bed, in NAD HEENT: [place]/AT, MM pink/moist, PERRL Nasal splints in place, Face tent   Neuro: Alert and oriented x3, non-focal  CV: s1s2 regular rate and rhythm, no murmur, rubs, or gallops,  PULM:  Shallow breaths bilaterally, faint rhonchi to bases, no increased work of breathing  GI: soft, bowel sounds active in all 4 quadrants, non-tender, non-distended Extremities: warm/dry, no edema  Skin: no rashes or lesions  Resolved Hospital Problem list   Toxic/Metabolic Encephalopathy  Assessment & Plan:  Acute hypoxemic respiratory failure/ARDS in the setting of nausea and vomiting status post anesthesia. -Due to presumed aspiration pneumonia -CT chest with diffuse bilateral infiltrates worse and more dense in bilateral lower lobe infiltrates.   -Other differential includes inflammatory noninfectious pneumonia such as organizing pneumonia, eosinophilic pneumonia, etc.  VS possible flash pulmonary edema, however no documentation of hypertension and given her age, cardiomyopathy or other severe valvular pathology seems unlikely. P: Continue Zosyn and Ceftriaxone Continue stress dose steroids   Continue supplemental oxygen via face tent for sats greater than 92% Wean oxygen as able  Aggressive and frequent pulmonary hygiene Frequent IS Mobilize  Aspiration precautions  Head of bed elevated 30 degrees Follow cultures  ANA and ENA pending  Follow ECHO given elevated BNP  History of GERD -Home medications include; Pepcid  P: Continue home PPI  Depression Insomnia  -Home medications include; Seroquel, Wellbutrin, and Trazodone   P: Continue home Trazodone and Wellbutrin  Monitor alertness and mentation given need for narcotics post surgery before resumption of home Seroquel  Best Practice (right click and "Reselect all SmartList Selections" daily)   Diet/type: Regular consistency (see orders) DVT prophylaxis: LMWH GI prophylaxis: N/A Lines: N/A Foley:  N/A Code Status:  full code Last date of multidisciplinary goals of care discussion: Continue to update patient and Mother daily   Critical care time: NA  London Tarnowski D. Tiburcio Pea, NP-C [facility] Personal contact information can be found on Amion  08/29/2022, 8:17 AM

## 2022-08-29 NOTE — Progress Notes (Signed)
  Echocardiogram 2D Echocardiogram has been performed.  Audrey Callahan 08/29/2022, 8:56 AM

## 2022-08-29 NOTE — H&P (Signed)
NAME:  Audrey Callahan, MRN:  [id number], DOB:  2000/12/31, LOS: 0 ADMISSION DATE:  08/28/2022, CONSULTATION DATE:  08/29/22 REFERRING MD:  ED doctor, CHIEF COMPLAINT:  nausea   History of Present Illness:  21 year old woman who underwent septoplasty 08/28/2022 presents to the ED with nausea vomiting and shortness of breath found to have hypoxemic respiratory failure with bilateral diffuse infiltrates on chest x-ray and CT scan concerning for ARDS due to aspiration pneumonia.  History difficult given mild encephalopathy presumably lingering effect from anesthesia.  History from patient and mother.  Prior to surgery she was fine for the respiratory issues.  No shortness of breath or dyspnea exertion.  No hypoxemic respiratory failure, hypoxemia.  After the surgery she is been very nauseated.  Vomiting at home.  Oral Zofran never helpful.  Presented to the ED.  Found to be hypoxemic.  Chest x-ray on my review interpretation reveals bilateral infiltrates concerning for multifocal pneumonia.  CTA PE protocol was obtained which showed no PE but did demonstrate diffuse nodular with more confluent infiltrates in the lower lobes with predilection for dependent areas concerning for multifocal pneumonia versus ARDS.  After discussion with ED doctor antibiotics were instituted.  She is wearing a bucket facemask given recent nasal surgery and packing.  Pertinent  Medical History  GERD, depression, insomnia  Significant Hospital Events: Including procedures, antibiotic start and stop dates in addition to other pertinent events   9/13 presents to ED 9/14 admitted to ICU with acute hypoxic respite failure related to aspiration pneumonia and ARDS  Interim History / Subjective:    Objective   Blood pressure 102/73, pulse 100, temperature 97.7 F (36.5 C), temperature source Oral, resp. rate (!) 29, height 5\' 4"  (1.626 m), weight 59.2 kg, last menstrual period 07/18/2022, SpO2 95 %.    FiO2 (%):  [36 %-60 %] 60  %   Intake/Output Summary (Last 24 hours) at 08/29/2022 0144 Last data filed at 08/29/2022 0053 Gross per 24 hour  Intake 1286.03 ml  Output --  Net 1286.03 ml   Filed Weights   08/29/22 0030  Weight: 59.2 kg    Examination: General: Thin, lying in bed HENT: Bandage over nose, packing in place, EOMI Lungs: Clear anteriorly, difficulty following commands with the posterior lung fields Cardiovascular: Warm, tachycardic Abdomen: Nondistended bowel sounds present Extremities: Warm, no edema Neuro: encephalopathic, follows commands   Resolved Hospital Problem list     Assessment & Plan:  Acute hypoxemic respiratory failure/ARDS due to presumed aspiration pneumonia: Diffuse bilateral worse and more dense in bilateral lower lobe infiltrates.  In the setting of nausea and vomiting status post anesthesia.  High suspicion for aspiration and subsequent ARDS.  Other differential includes inflammatory noninfectious pneumonia such as organizing pneumonia, eosinophilic pneumonia, etc.  While diffuse, infiltrates have predilection for dependent areas including the posterior upper lobes and lower lobes.  This raises the case for possible flash pulmonary edema.  However I see no documentation of hypertension. Given her age, cardiomyopathy or other severe valvular pathology seems unlikely. -- Zosyn and azithromycin -- Procalcitonin -- Sed rate, CRP, ANA, ENA -- BNP, consider TTE if elevated, lasix in AM -- Solu-Medrol 60 IV daily (1 mg/kg) in setting of severe community-acquired pneumonia possible inflammatory lung disease/ILD  History of GERD: Holding meds for now.  Consider resuming if needed.  Depression: Resume Wellbutrin.  Insomnia: Trazodone ordered. Holding Seroquel with encephalopathy.  Toxic/Metabolic Encephalopathy: Suspect due to anesthesia.  --Hold centrally acting meds for now  [employer] (right  click and "Reselect all SmartList Selections" daily)   Diet/type: Regular  consistency (see orders) DVT prophylaxis: LMWH GI prophylaxis: N/A Lines: N/A Foley:  N/A Code Status:  full code Last date of multidisciplinary goals of care discussion [n/a]  Labs   CBC: Recent Labs  Lab 08/28/22 1132 08/28/22 1949 08/29/22 0115  WBC 5.5 16.1* 17.6*  NEUTROABS  --   --  16.0*  HGB 13.5 13.7 13.5  HCT 39.8 39.8 39.9  MCV 87.1 85.6 88.5  PLT 298 326 309    Basic Metabolic Panel: Recent Labs  Lab 08/28/22 1949  NA 134*  K 4.1  CL 100  CO2 22  GLUCOSE 168*  BUN 13  CREATININE 0.62  CALCIUM 8.8*   GFR: Estimated Creatinine Clearance: 94.4 mL/min (by C-G formula based on SCr of 0.62 mg/dL). Recent Labs  Lab 08/28/22 1132 08/28/22 1949 08/28/22 2105 08/29/22 0115  WBC 5.5 16.1*  --  17.6*  LATICACIDVEN  --   --  1.8  --     Liver Function Tests: No results for input(s): "AST", "ALT", "ALKPHOS", "BILITOT", "PROT", "ALBUMIN" in the last 168 hours. No results for input(s): "LIPASE", "AMYLASE" in the last 168 hours. No results for input(s): "AMMONIA" in the last 168 hours.  ABG No results found for: "PHART", "PCO2ART", "PO2ART", "HCO3", "TCO2", "ACIDBASEDEF", "O2SAT"   Coagulation Profile: No results for input(s): "INR", "PROTIME" in the last 168 hours.  Cardiac Enzymes: No results for input(s): "CKTOTAL", "CKMB", "CKMBINDEX", "TROPONINI" in the last 168 hours.  HbA1C: No results found for: "HGBA1C"  CBG: No results for input(s): "GLUCAP" in the last 168 hours.  Review of Systems:   Unobtainable due to patient factors, encephalopathy  Past Medical History:  She,  has a past medical history of Anemia, Anxiety, Asthma, and Depression.   Surgical History:   Past Surgical History:  Procedure Laterality Date   MULTIPLE TOOTH EXTRACTIONS  2013   NASAL SEPTUM SURGERY  08/28/2022     Social History:   reports that she has never smoked. She has never been exposed to tobacco smoke. She has never used smokeless tobacco. She reports  current alcohol use of about 3.0 standard drinks of alcohol per week. She reports that she does not use drugs.   Family History:  Her family history is not on file.   Allergies Allergies  Allergen Reactions   Lactose Intolerance (Gi)     Upset stomach    Lidocaine Viscous Hcl Nausea And Vomiting   Red Dye Nausea Only and Rash     Home Medications  Prior to Admission medications   Medication Sig Start Date End Date Taking? Authorizing Provider  acetaminophen (TYLENOL) 325 MG tablet Take 650 mg by mouth every 6 (six) hours as needed for moderate pain.    [provider]  buPROPion (WELLBUTRIN XL) 150 MG 24 hr tablet Take 150 mg by mouth every morning. 07/31/22   [provider]  Cholecalciferol (VITAMIN D) 50 MCG (2000 UT) tablet Take 2,000 Units by mouth daily.    [provider]  diphenhydrAMINE (BENADRYL) 25 MG tablet Take 25 mg by mouth every 6 (six) hours as needed for allergies.    [provider]  docusate sodium (COLACE) 100 MG capsule Take 1 capsule (100 mg total) by mouth 2 (two) times daily as needed for up to 10 days. 08/28/22 09/07/22  Skotnicki, Meghan A, DO  drospirenone-ethinyl estradiol (YAZ) 3-0.02 MG tablet Take 1 tablet by mouth daily. 06/24/22   [provider]  famotidine (PEPCID) 20 MG tablet Take 20 mg by mouth at bedtime. 08/11/22   [provider]  ferrous sulfate 325 (65 FE) MG tablet Take 650 mg by mouth daily.    [provider]  HYDROcodone-acetaminophen (NORCO/VICODIN) 5-325 MG tablet Take 1 tablet by mouth every 6 (six) hours as needed for up to 5 days for moderate pain. 08/28/22 09/02/22  Skotnicki, Meghan A, DO  ibuprofen (ADVIL) 200 MG tablet Take 400 mg by mouth every 6 (six) hours as needed for moderate pain.    [provider]  lactase (LACTAID) 3000 units tablet Take 3,000 Units by mouth daily as needed (lactose intolerance).    [provider]  Melatonin 5 MG CAPS Take 5 mg by  mouth at bedtime as needed (sleep).    [provider]  omeprazole (PRILOSEC OTC) 20 MG tablet Take 20 mg by mouth daily as needed (acid reflux).    [provider]  ondansetron (ZOFRAN) 4 MG tablet Take 1 tablet (4 mg total) by mouth every 8 (eight) hours as needed for up to 7 days for nausea or vomiting. 08/28/22 09/04/22  Skotnicki, Meghan A, DO  Probiotic Product (PROBIOTIC PO) Take 1 capsule by mouth daily.    [provider]  QUEtiapine (SEROQUEL) 100 MG tablet Take 100 mg by mouth at bedtime. Take with 50 mg to equal 150 mg at bedtime 06/19/22   [provider]  QUEtiapine (SEROQUEL) 50 MG tablet Take 50 mg by mouth at bedtime. Take with 100 mg to equal 150 mg at bedtime 08/08/22   [provider]  traZODone (DESYREL) 50 MG tablet Take 50 mg by mouth at bedtime. 07/25/22   [provider]     Critical care time:     CRITICAL CARE Performed by: Karren Burly MD   Total critical care time: 45 minutes  Critical care time was exclusive of separately billable procedures and treating other patients.  Critical care was necessary to treat or prevent imminent or life-threatening deterioration.  Critical care was time spent personally by me on the following activities: development of treatment plan with patient and/or surrogate as well as nursing, discussions with consultants, evaluation of patient's response to treatment, examination of patient, obtaining history from patient or surrogate, ordering and performing treatments and interventions, ordering and review of laboratory studies, ordering and review of radiographic studies, pulse oximetry and re-evaluation of patient's condition.

## 2022-08-29 NOTE — Progress Notes (Signed)
eLink Physician-Brief Progress Note Patient Name: Audrey Callahan DOB: 01-30-2001 MRN: [id number]   Date of Service  08/29/2022  HPI/Events of Note  Notified of soft blood pressures in the 80s-90s. Pt has lasix due.   eICU Interventions  Hold diuretic for now.      Intervention Category Intermediate Interventions: Hypotension - evaluation and management  Larinda Buttery 08/29/2022, 6:32 AM

## 2022-08-29 NOTE — ED Notes (Signed)
Report called to RT at [facility]

## 2022-08-29 NOTE — Progress Notes (Signed)
Okay to update patient's father who is in [place] per patient and mom. Father is Tayllor Breitenstein [phone].

## 2022-08-29 NOTE — Consult Note (Addendum)
Cardiology Consultation:   Patient ID: Audrey Callahan MRN: [id number]; DOB: 2001-10-25  Admit date: 08/28/2022 Date of Consult: 08/29/2022  Primary Care Provider: Soundra Pilon, FNP [facility] Cardiologist: None  [facility] Electrophysiologist:  None    Patient Profile:   Audrey Callahan is a 21 y.o. female with a hx of anxiety and depression who is being seen today for the evaluation of new dilated cardiomyopathy at the request of Virl Diamond, MD.  History of Present Illness:   Audrey Callahan is a 21yo female with a hx of anxiety and depression who underwent septoplasty 08/28/2022 and presented to the ER today with nausea, vomiting and SOB and found to be in hypoxemic respiratory failure with bilateral diffuse infiltrates on CXray and CT worrisome for ARDs from aspiration PNA.  Prior to her surgery she had no problems with SOB or DOE or chest pain.    After surgery she became nauseated and started vomiting at home with no improvement with Zofran and came to the ER.  Chest CTA was neg for PE but showed lung findings worrisome for multifocal PNA vs. ARDS and started on antibx.  PCCM saw patient and was also worried about possible flash pulmonary edema given that infiltrates were worse in the bases.   2D echo was obtained which demonstrated moderate LV dysfunction with EF 35-40% with global HK, hyperdynamic RVF, trivial MR and RAP .  Cardiology is now asked to evaluate due to new DCM.  Throughout the day she has become progressively more tachycardic and developed hypotension with BP as low as 74/79mmHg and HR as high as 138bpm with minimal movement in the bed. O2 sats 98% on RA.  The patient denies any prior hx of SOB, PND, LE edema, chest pain. She tells me that over the past year she has noticed that when she is out walking her heart rate on her apple watch will shoot up into the 160's just with gingerly walking.  She denies any recent viral symptoms.  She is adopted and does  not know her biological family hx.    Past Medical History:  Diagnosis Date   Anemia    Anxiety    Asthma    Depression     Past Surgical History:  Procedure Laterality Date   MULTIPLE TOOTH EXTRACTIONS  2013   NASAL HEMORRHAGE CONTROL Bilateral 08/28/2022   Procedure: EPISTAXIS CONTROL;  Surgeon: Laren Boom, DO;  Location: [facility] OR;  Service: ENT;  Laterality: Bilateral;   NASAL SEPTOPLASTY W/ TURBINOPLASTY Bilateral 08/28/2022   Procedure: NASAL SEPTOPLASTY WITH TURBINATE REDUCTION AND REPAIR OF NASAL VALVE COLLAPSE;  Surgeon: Laren Boom, DO;  Location: [facility] OR;  Service: ENT;  Laterality: Bilateral;   NASAL SEPTUM SURGERY  08/28/2022     Home Medications:  Prior to Admission medications   Medication Sig Start Date End Date Taking? Authorizing Provider  acetaminophen (TYLENOL) 325 MG tablet Take 650 mg by mouth every 6 (six) hours as needed for moderate pain.   Yes [provider]  buPROPion (WELLBUTRIN XL) 150 MG 24 hr tablet Take 150 mg by mouth every morning. 07/31/22  Yes [provider]  Cholecalciferol (VITAMIN D) 50 MCG (2000 UT) tablet Take 2,000 Units by mouth daily.   Yes [provider]  diphenhydrAMINE (BENADRYL) 25 MG tablet Take 25 mg by mouth every 6 (six) hours as needed for allergies.   Yes [provider]  docusate sodium (COLACE) 100 MG capsule Take 1 capsule (100 mg total)  by mouth 2 (two) times daily as needed for up to 10 days. Patient taking differently: Take 100 mg by mouth 2 (two) times daily as needed for mild constipation. 08/28/22 09/07/22 Yes Skotnicki, Meghan A, DO  drospirenone-ethinyl estradiol (YAZ) 3-0.02 MG tablet Take 1 tablet by mouth daily. 06/24/22  Yes [provider]  famotidine (PEPCID) 20 MG tablet Take 20 mg by mouth at bedtime. 08/11/22  Yes [provider]  ferrous sulfate 325 (65 FE) MG tablet Take 325 mg by mouth daily.   Yes [provider]   HYDROcodone-acetaminophen (NORCO/VICODIN) 5-325 MG tablet Take 1 tablet by mouth every 6 (six) hours as needed for up to 5 days for moderate pain. 08/28/22 09/02/22 Yes Skotnicki, Meghan A, DO  ibuprofen (ADVIL) 200 MG tablet Take 400 mg by mouth every 6 (six) hours as needed for moderate pain.   Yes [provider]  lactase (LACTAID) 3000 units tablet Take 3,000 Units by mouth daily as needed (lactose intolerance).   Yes [provider]  Melatonin 5 MG CAPS Take 5 mg by mouth at bedtime as needed (sleep).   Yes [provider]  omeprazole (PRILOSEC OTC) 20 MG tablet Take 20 mg by mouth daily as needed (acid reflux).   Yes [provider]  ondansetron (ZOFRAN) 4 MG tablet Take 1 tablet (4 mg total) by mouth every 8 (eight) hours as needed for up to 7 days for nausea or vomiting. 08/28/22 09/04/22 Yes Skotnicki, Meghan A, DO  Probiotic Product (PROBIOTIC PO) Take 1 capsule by mouth daily.   Yes [provider]  QUEtiapine (SEROQUEL) 100 MG tablet Take 100 mg by mouth at bedtime. Take with 50 mg to equal 150 mg at bedtime 06/19/22  Yes [provider]  QUEtiapine (SEROQUEL) 50 MG tablet Take 50 mg by mouth at bedtime. Take with 100 mg to equal 150 mg at bedtime 08/08/22  Yes [provider]  traZODone (DESYREL) 50 MG tablet Take 50 mg by mouth at bedtime. 07/25/22  Yes [provider]    Inpatient Medications: Scheduled Meds:  buPROPion  150 mg Oral q morning   Chlorhexidine Gluconate Cloth  6 each Topical Daily   enoxaparin (LOVENOX) injection  40 mg Subcutaneous Q24H   famotidine  20 mg Oral QHS   fentaNYL       fentaNYL (SUBLIMAZE) injection  25 mcg Intravenous Once   methylPREDNISolone (SOLU-MEDROL) injection  60 mg Intravenous Daily   midazolam       midazolam  0.5 mg Intravenous Once   mouth rinse  15 mL Mouth Rinse 4 times per day   traZODone  50 mg Oral QHS   Continuous Infusions:  sodium chloride     azithromycin      lactated ringers 75 mL/hr at 08/29/22 1353   phenylephrine (NEO-SYNEPHRINE) Adult infusion     piperacillin-tazobactam (ZOSYN)  IV 3.375 g (08/29/22 1352)   PRN Meds: acetaminophen, fentaNYL, midazolam, morphine injection, ondansetron (ZOFRAN) IV, mouth rinse, mouth rinse, oxyCODONE, polyethylene glycol  Allergies:    Allergies  Allergen Reactions   Lactose Intolerance (Gi)     Upset stomach    Lidocaine Viscous Hcl Nausea And Vomiting   Red Dye Nausea Only and Rash    Social History:   Social History   Socioeconomic History   Marital status: Single    Spouse name: Not on file   Number of children: Not on file   Years of education: Not on file   Highest education  level: Not on file  Occupational History   Not on file  Tobacco Use   Smoking status: Never    Passive exposure: Never   Smokeless tobacco: Never  Vaping Use   Vaping Use: Never used  Substance and Sexual Activity   Alcohol use: Yes    Alcohol/week: 3.0 standard drinks of alcohol    Types: 3 Glasses of wine per week    Comment: pt reports 2-3 drinks twice a month   Drug use: Never   Sexual activity: Yes    Birth control/protection: Pill  Other Topics Concern   Not on file  Social History Narrative   Not on file   Social Determinants of Health   Financial Resource Strain: Not on file  Food Insecurity: Not on file  Transportation Needs: Not on file  Physical Activity: Not on file  Stress: Not on file  Social Connections: Not on file  Intimate Partner Violence: Not on file    Family History:   Family History  Adopted: Yes     ROS:  Please see the history of present illness.   All other ROS reviewed and negative.     Physical Exam/Data:   Vitals:   08/29/22 1200 08/29/22 1300 08/29/22 1318 08/29/22 1320  BP: (!) 85/50 (!) 79/52 (!) 74/21 (!) 83/47  Pulse: (!) 113 (!) 109 (!) 112 (!) 110  Resp: (!) 29 (!) 26 (!) 23 (!) 29  Temp:      TempSrc:      SpO2: 97% 98% 98% 99%  Weight:       Height:        Intake/Output Summary (Last 24 hours) at 08/29/2022 1439 Last data filed at 08/29/2022 1300 Gross per 24 hour  Intake 1937.06 ml  Output 2150 ml  Net -212.94 ml      08/29/2022    5:00 AM 08/29/2022   12:30 AM 08/28/2022   10:26 AM  Last 3 Weights  Weight (lbs) 130 lb 8.2 oz 130 lb 8.2 oz 124 lb  Weight (kg) 59.2 kg 59.2 kg 56.246 kg     Body mass index is 22.4 kg/m.  General:  Well nourished, well developed, in no acute distress HEENT: normal Lymph: no adenopathy Neck: no JVD Endocrine:  No thryomegaly Vascular: No carotid bruits; FA pulses 2+ bilaterally without bruits  Cardiac:  normal S1, S2; RRR; no murmur  Lungs:  clear to auscultation bilaterally, no wheezing, rhonchi or rales  Abd: soft, nontender, no hepatomegaly  Ext: no edema Musculoskeletal:  No deformities, BUE and BLE strength normal and equal Skin: warm and dry  Neuro:  CNs 2-12 intact, no focal abnormalities noted Psych:  Normal affect   EKG:  The EKG was personally reviewed and demonstrates: Sinus tachycardia at 105bpm  with no acute ST changes Telemetry:  Telemetry was personally reviewed and demonstrates:  NSR  Relevant CV Studies: 2D echo 08/29/2022 IMPRESSIONS    1. No mural thrombus by Definity contrast. Left ventricular ejection  fraction, by estimation, is 35 to 40%. Left ventricular ejection fraction  by 2D MOD biplane is 35.9 %. The left ventricle has moderately decreased  function. The left ventricle  demonstrates global hypokinesis. Indeterminate diastolic filling due to  E-A fusion.   2. Right ventricular systolic function is hyperdynamic. The right  ventricular size is normal. Tricuspid regurgitation signal is inadequate  for assessing PA pressure.   3. The mitral valve is grossly normal. Trivial mitral valve  regurgitation.   4. The aortic  valve is tricuspid. Aortic valve regurgitation is not  visualized.   5. The inferior vena cava is normal in size with greater than  50%  respiratory variability, suggesting right atrial pressure of 3 mmHg.   Comparison(s): No prior Echocardiogram.   Laboratory Data:  High Sensitivity Troponin:  No results for input(s): "TROPONINIHS" in the last 720 hours.   Chemistry Recent Labs  Lab 08/28/22 1949 08/29/22 0115  NA 134* 136  K 4.1 4.0  CL 100 110  CO2 22 20*  GLUCOSE 168* 164*  BUN 13 10  CREATININE 0.62 0.57  CALCIUM 8.8* 8.4*  GFRNONAA >60 >60  ANIONGAP 12 6    No results for input(s): "PROT", "ALBUMIN", "AST", "ALT", "ALKPHOS", "BILITOT" in the last 168 hours. Hematology Recent Labs  Lab 08/28/22 1132 08/28/22 1949 08/29/22 0115  WBC 5.5 16.1* 17.6*  RBC 4.57 4.65 4.51  HGB 13.5 13.7 13.5  HCT 39.8 39.8 39.9  MCV 87.1 85.6 88.5  MCH 29.5 29.5 29.9  MCHC 33.9 34.4 33.8  RDW 12.5 12.4 12.5  PLT 298 326 309   BNP Recent Labs  Lab 08/29/22 0115  BNP 302.7*    DDimer No results for input(s): "DDIMER" in the last 168 hours.   Radiology/Studies:  ECHOCARDIOGRAM COMPLETE  Result Date: 08/29/2022    ECHOCARDIOGRAM REPORT   Patient Name:   MALAIKA ARNALL Date of Exam: 08/29/2022 Medical Rec #:  [id number]      Height:       64.0 in Accession #:    [id number]     Weight:       130.5 lb Date of Birth:  05/19/2001      BSA:          1.632 m Patient Age:    21 years       BP:           90/61 mmHg Patient Gender: F              HR:           119 bpm. Exam Location:  Inpatient Procedure: 2D Echo and Intracardiac Opacification Agent Indications:    Dyspnea  History:        Patient has no prior history of Echocardiogram examinations.  Sonographer:    Harvie Junior Referring Phys: [phone] MATTHEW R HUNSUCKER  Sonographer Comments: Technically difficult study due to poor echo windows and no subcostal window. Image acquisition challenging due to respiratory motion and sitting position. IMPRESSIONS  1. No mural thrombus by Definity contrast. Left ventricular ejection fraction, by estimation, is 35 to 40%. Left  ventricular ejection fraction by 2D MOD biplane is 35.9 %. The left ventricle has moderately decreased function. The left ventricle demonstrates global hypokinesis. Indeterminate diastolic filling due to E-A fusion.  2. Right ventricular systolic function is hyperdynamic. The right ventricular size is normal. Tricuspid regurgitation signal is inadequate for assessing PA pressure.  3. The mitral valve is grossly normal. Trivial mitral valve regurgitation.  4. The aortic valve is tricuspid. Aortic valve regurgitation is not visualized.  5. The inferior vena cava is normal in size with greater than 50% respiratory variability, suggesting right atrial pressure of 3 mmHg. Comparison(s): No prior Echocardiogram. FINDINGS  Left Ventricle: No mural thrombus by Definity contrast. Left ventricular ejection fraction, by estimation, is 35 to 40%. Left ventricular ejection fraction by 2D MOD biplane is 35.9 %. The left ventricle has moderately decreased function. The left ventricle demonstrates global hypokinesis. Definity contrast agent was given  IV to delineate the left ventricular endocardial borders. The left ventricular internal cavity size was normal in size. There is no left ventricular hypertrophy. Indeterminate diastolic filling due to E-A fusion. Right Ventricle: The right ventricular size is normal. No increase in right ventricular wall thickness. Right ventricular systolic function is hyperdynamic. Tricuspid regurgitation signal is inadequate for assessing PA pressure. Left Atrium: Left atrial size was normal in size. Right Atrium: Right atrial size was normal in size. Pericardium: There is no evidence of pericardial effusion. Mitral Valve: The mitral valve is grossly normal. Trivial mitral valve regurgitation. Tricuspid Valve: The tricuspid valve is grossly normal. Tricuspid valve regurgitation is trivial. Aortic Valve: The aortic valve is tricuspid. Aortic valve regurgitation is not visualized. Aortic valve mean  gradient measures 3.0 mmHg. Aortic valve peak gradient measures 4.6 mmHg. Aortic valve area, by VTI measures 2.64 cm. Pulmonic Valve: The pulmonic valve was normal in structure. Pulmonic valve regurgitation is not visualized. Aorta: The aortic root and ascending aorta are structurally normal, with no evidence of dilitation. Venous: The inferior vena cava is normal in size with greater than 50% respiratory variability, suggesting right atrial pressure of 3 mmHg. IAS/Shunts: No atrial level shunt detected by color flow Doppler.  LEFT VENTRICLE PLAX 2D                        Biplane EF (MOD) LVIDd:         4.10 cm         LV Biplane EF:   Left LVIDs:         2.80 cm                          ventricular LV PW:         0.60 cm                          ejection LV IVS:        0.60 cm                          fraction by LVOT diam:     2.00 cm                          2D MOD LV SV:         47                               biplane is LV SV Index:   29                               35.9 %. LVOT Area:     3.14 cm                                Diastology                                LV e' medial:    8.38 cm/s LV Volumes (MOD)               LV E/e' medial:  9.9 LV vol d, MOD  58.7 ml       LV e' lateral:   11.40 cm/s A2C:                           LV E/e' lateral: 7.3 LV vol d, MOD    62.0 ml A4C: LV vol s, MOD    38.4 ml A2C: LV vol s, MOD    38.2 ml A4C: LV SV MOD A2C:   20.3 ml LV SV MOD A4C:   62.0 ml LV SV MOD BP:    21.7 ml RIGHT VENTRICLE RV S prime:     17.40 cm/s TAPSE (M-mode): 2.2 cm LEFT ATRIUM             Index       RIGHT ATRIUM          Index LA diam:        2.10 cm 1.29 cm/m  RA Area:     6.30 cm LA Vol (A2C):   13.6 ml 8.33 ml/m  RA Volume:   9.96 ml  6.10 ml/m LA Vol (A4C):   15.5 ml 9.50 ml/m LA Biplane Vol: 14.7 ml 9.01 ml/m  AORTIC VALVE                    PULMONIC VALVE AV Area (Vmax):    2.72 cm     PV Vmax:       0.90 m/s AV Area (Vmean):   2.68 cm     PV Peak grad:  3.2 mmHg AV Area  (VTI):     2.64 cm AV Vmax:           107.00 cm/s AV Vmean:          74.700 cm/s AV VTI:            0.177 m AV Peak Grad:      4.6 mmHg AV Mean Grad:      3.0 mmHg LVOT Vmax:         92.50 cm/s LVOT Vmean:        63.800 cm/s LVOT VTI:          0.149 m LVOT/AV VTI ratio: 0.84  AORTA Ao Root diam: 3.00 cm Ao Asc diam:  2.60 cm MITRAL VALVE MV Area (PHT): 6.60 cm    SHUNTS MV Decel Time: 115 msec    Systemic VTI:  0.15 m MR Peak grad: 18.8 mmHg    Systemic Diam: 2.00 cm MR Vmax:      217.00 cm/s MV E velocity: 83.10 cm/s MV A velocity: 23.60 cm/s MV E/A ratio:  3.52 Lyman Bishop MD Electronically signed by Lyman Bishop MD Signature Date/Time: 08/29/2022/11:43:00 AM    Final    CT Angio Chest PE W and/or Wo Contrast  Result Date: 08/28/2022 CLINICAL DATA:  Vomiting after septoplasty; PE suspected high probability EXAM: CT ANGIOGRAPHY CHEST WITH CONTRAST TECHNIQUE: Multidetector CT imaging of the chest was performed using the standard protocol during bolus administration of intravenous contrast. Multiplanar CT image reconstructions and MIPs were obtained to evaluate the vascular anatomy. RADIATION DOSE REDUCTION: This exam was performed according to the departmental dose-optimization program which includes automated exposure control, adjustment of the mA and/or kV according to patient size and/or use of iterative reconstruction technique. CONTRAST:  85mL OMNIPAQUE IOHEXOL 350 MG/ML SOLN COMPARISON:  Radiographs earlier today FINDINGS: Cardiovascular: Satisfactory opacification of the pulmonary arteries to the segmental level. No evidence of pulmonary embolism. Normal heart  size. No pericardial effusion. Mediastinum/Nodes: No enlarged mediastinal, hilar, or axillary lymph nodes. Thyroid gland, trachea, and esophagus demonstrate no significant findings. Lungs/Pleura: Patchy ground-glass opacities throughout the lungs which are more confluence in the bilateral lower lobes. The central airways are patent. No pleural  effusion or pneumothorax Upper Abdomen: No acute abnormality. Musculoskeletal: No chest wall abnormality. No acute osseous findings. Review of the MIP images confirms the above findings. IMPRESSION: 1. Patchy ground-glass opacities greatest in the lower lobes suggestive of pneumonia and/or aspiration. 2. Negative for acute pulmonary embolism. Electronically Signed   By: Placido Sou M.D.   On: 08/28/2022 21:34   DG Chest Portable 1 View  Result Date: 08/28/2022 CLINICAL DATA:  Vomiting after septoplasty. EXAM: PORTABLE CHEST 1 VIEW COMPARISON:  None Available. FINDINGS: The heart size and mediastinal contours are within normal limits. Moderate severity diffuse bilateral infiltrates are noted. There is no evidence of a pleural effusion or pneumothorax. The visualized skeletal structures are unremarkable. IMPRESSION: Moderate severity diffuse bilateral infiltrates. Electronically Signed   By: Virgina Norfolk M.D.   On: 08/28/2022 20:56     Assessment and Plan:   Dilated cardiomyopathy/Acute Hypoxemic respiratory failure -etiology unknown -admitted with acute hypoxemic respiratory failure presumed secondary to aspiration PNA vs. ARDS -CBC elevated but likely related to IV steroids -no PE on CT but lung findings c/w aspiration PNA or ARDS per Radiology -TSH borderline elevated at 5.77 -BNP 302 -CRP 1.6 -procalcitonin <0.1 and lactic acid 1.8>>not consistent with severe infectious etiology -she is markedly hypotensive with BP as low as 74/99mmHg but currently 83/28mmHg. -given a normal procalcitonin and lactic acid and elevated ESR in setting of new DCM and hypotension/tachycardia  >> need to consider acute myocarditis with flash pulmonary edema -throughout the day her BP has trended further down as low as 74/65mmHg and HR as high as the 130's.  O2 sats currently are normal on RA -CCM currently placing central line -check CVP and Co-Ox -check a stat hsTroponin -start Levo gtt to maintain  MAP>23mmHg -once stabilized will transfer to [facility] CCU to advanced HF service  This was all discussed with her mother.          New York Heart Association (NYHA) Functional Class NYHA Class IV      Performed by: Fransico Him, MD   Total critical care time: 50 minutes   Critical care time was exclusive of separately billable procedures and treating other patients.   Critical care was necessary to treat or prevent imminent or life-threatening deterioration.   Critical care was time spent personally by me (independent of APPs or residents) on the following activities: development of treatment plan with patient and/or surrogate as well as nursing, discussions with consultants, evaluation of patient's response to treatment, examination of patient, obtaining history from patient or surrogate, ordering and performing treatments and interventions, ordering and review of laboratory studies, ordering and review of radiographic studies, pulse oximetry and re-evaluation of patient's condition.   For questions or updates, please contact [place] Please consult [email or web address] for contact info under    Signed, Fransico Him, MD  08/29/2022 2:39 PM

## 2022-08-29 NOTE — Procedures (Signed)
Central Venous Catheter Insertion Procedure Note  Audrey Callahan  [id number]  2001/04/24  Date:08/29/22  Time:3:25 PM   Provider Performing:Dayzha Pogosyan D. Harris   Procedure: Insertion of Non-tunneled Central Venous [phone]) with US guidance ([id number])   Indication(s) Medication administration  Consent Risks of the procedure as well as the alternatives and risks of each were explained to the patient and/or caregiver.  Consent for the procedure was obtained and is signed in the bedside chart  Anesthesia Topical only with 1% lidocaine   Timeout Verified patient identification, verified procedure, site/side was marked, verified correct patient position, special equipment/implants available, medications/allergies/relevant history reviewed, required imaging and test results available.  Sterile Technique Maximal sterile technique including full sterile barrier drape, hand hygiene, sterile gown, sterile gloves, mask, hair covering, sterile ultrasound probe cover (if used).  Procedure Description Area of catheter insertion was cleaned with chlorhexidine and draped in sterile fashion.  With real-time ultrasound guidance a central venous catheter was placed into the right internal jugular vein. Nonpulsatile blood flow and easy flushing noted in all ports.  The catheter was sutured in place at 18cm given height and sterile dressing applied.  Complications/Tolerance None; patient tolerated the procedure well. Chest X-ray is ordered to verify placement for internal jugular or subclavian cannulation.   Chest x-ray is not ordered for femoral cannulation.  EBL Minimal  Specimen(s) None  Shelden Raborn D. Tiburcio Pea, NP-C [facility] Personal contact information can be found on Amion  08/29/2022, 3:27 PM

## 2022-08-29 NOTE — Progress Notes (Signed)
eLink Physician-Brief Progress Note Patient Name: Audrey Callahan DOB: 10-13-2001 MRN: [id number]   Date of Service  08/29/2022  HPI/Events of Note  21/F who is s/p nasal septoplasty with turbinate reduction and repair of nasal valve collapse on 08/28/22 but had persistent nausea and vomiting even after leaving the hospital.  She was hypoxic in the 80s with imaging  showing bilateral infiltrates.  Pt started on Zosyn and azithromycin and admitted to the ICU.   Pt is awake and alert, not in distress.  She is able to answer questions appropriately.  BP 99/74, HR 96, RR 26, O2 sats 93% on face tent 60% with 10L flow.   CT chest 1. Patchy ground-glass opacities greatest in the lower lobes suggestive of pneumonia and/or aspiration. 2. Negative for acute pulmonary embolism.  eICU Interventions  Admit to the ICU.  Continue empiric antibiotics. Pt started on IV steroids as well.  Monitor I&O. Pt given Lasix.   Compazine prn for nausea.  Continue O2 support.  Resume home medications.  Lovenox for DVT prophylaxis.      Intervention Category Evaluation Type: New Patient Evaluation  Larinda Buttery 08/29/2022, 12:44 AM

## 2022-08-29 NOTE — Progress Notes (Signed)
Pharmacy Antibiotic Note  Audrey Callahan is a 21 y.o. female admitted on 08/28/2022 with aspiration pneumonia.  Pharmacy has been consulted for Zosyn dosing.  Plan: Zosyn 3.375g IV q8h (4 hour infusion). Azithromycin per MD Need for further dosage adjustment appears unlikely at present.    Will sign off at this time.  Please reconsult if a change in clinical status warrants re-evaluation of dosage.     Temp (24hrs), Avg:97.8 F (36.6 C), Min:97 F (36.1 C), Max:98.1 F (36.7 C)  Recent Labs  Lab 08/28/22 1132 08/28/22 1949 08/28/22 2105  WBC 5.5 16.1*  --   CREATININE  --  0.62  --   LATICACIDVEN  --   --  1.8    Estimated Creatinine Clearance: 94.4 mL/min (by C-G formula based on SCr of 0.62 mg/dL).    Allergies  Allergen Reactions   Lactose Intolerance (Gi)     Upset stomach    Lidocaine Viscous Hcl Nausea And Vomiting   Red Dye Nausea Only and Rash     Thank you for allowing pharmacy to be a part of this patient's care.  Maryellen Pile, PharmD 08/29/2022 12:40 AM

## 2022-08-29 NOTE — Progress Notes (Signed)
Progress Note  Echocardiogram reviewed and revealed moderately reduced function with EF 35-40% with left ventricular global hypokinesis. Further history from patient and mother indicate that patient has been experiencing significant tachycardia out of proportion to exertion for the last year. Patient states that her heart rate will jump as high as 170 with brisk walk, this is observed with Apple watch. She denies any associated symptoms including SOB, dizziness, or diaphoresis.   Cardiology consult placed.   Additionally patients TSH is elevated at 5.775 therefore Free T3 and T4 ordered to determine underlying Primary Hypothyroidism vs Euthyroid Sick Syndrome  Audrey Callahan D. Tiburcio Pea, NP-C [facility] Personal contact information can be found on Amion  08/29/2022, 1:08 PM

## 2022-08-30 ENCOUNTER — Inpatient Hospital Stay (HOSPITAL_COMMUNITY): Payer: BC Managed Care – PPO

## 2022-08-30 DIAGNOSIS — I5021 Acute systolic (congestive) heart failure: Secondary | ICD-10-CM | POA: Diagnosis not present

## 2022-08-30 DIAGNOSIS — J9601 Acute respiratory failure with hypoxia: Secondary | ICD-10-CM

## 2022-08-30 LAB — BASIC METABOLIC PANEL
Anion gap: 8 (ref 5–15)
BUN: 9 mg/dL (ref 6–20)
CO2: 22 mmol/L (ref 22–32)
Calcium: 8.2 mg/dL — ABNORMAL LOW (ref 8.9–10.3)
Chloride: 107 mmol/L (ref 98–111)
Creatinine, Ser: 0.57 mg/dL (ref 0.44–1.00)
GFR, Estimated: >60 mL/min (ref >60)
Glucose, Bld: 98 mg/dL (ref 70–99)
Potassium: 3.8 mmol/L (ref 3.5–5.1)
Sodium: 137 mmol/L (ref 135–145)

## 2022-08-30 LAB — COOXEMETRY PANEL
Carboxyhemoglobin: 2 % — ABNORMAL HIGH (ref 0.5–1.5)
Methemoglobin: <0.7 % (ref 0.0–1.5)
O2 Saturation: 74.7 %
Total hemoglobin: 12.2 g/dL (ref 12.0–16.0)

## 2022-08-30 LAB — ANTIEXTRACTABLE NUCLEAR AG
ENA SM Ab Ser-aCnc: <0.2 AI (ref 0.0–0.9)
Ribonucleic Protein: <0.2 AI (ref 0.0–0.9)

## 2022-08-30 LAB — MAGNESIUM: Magnesium: 2.1 mg/dL (ref 1.7–2.4)

## 2022-08-30 LAB — ANA: Anti Nuclear Antibody (ANA): NEGATIVE

## 2022-08-30 LAB — T3: T3, Total: 219 ng/dL — ABNORMAL HIGH (ref 71–180)

## 2022-08-30 MED ORDER — METHYLPREDNISOLONE SODIUM SUCC 40 MG IJ SOLR: 40.0000 mg | Freq: Every day | INTRAMUSCULAR | Status: DC

## 2022-08-30 MED ORDER — OXYMETAZOLINE HCL 0.05 % NA SOLN
3.0000 | Freq: Two times a day (BID) | NASAL | Status: DC | PRN
  Filled 2022-08-30: qty 30

## 2022-08-30 MED ORDER — LACTATED RINGERS IV BOLUS
1000.0000 mL | Freq: Once | INTRAVENOUS | Status: AC
  Administered 2022-08-30: 1000 mL via INTRAVENOUS

## 2022-08-30 MED ORDER — SALINE SPRAY 0.65 % NA SOLN
2.0000 | NASAL | Status: DC
  Administered 2022-08-30 – 2022-09-01 (×15): 2 via NASAL
  Filled 2022-08-30: qty 44

## 2022-08-30 MED ORDER — GADOBUTROL 1 MMOL/ML IV SOLN
8.0000 mL | Freq: Once | INTRAVENOUS | Status: AC | PRN
  Administered 2022-08-30: 8 mL via INTRAVENOUS

## 2022-08-30 NOTE — Progress Notes (Signed)
Patient will not keep mask on face. Refuses to wear the face tent also. SATs are 91% with a 40% venturi mask propped up with washcloths on her chest, at her mouth/nose area. RN aware.

## 2022-08-30 NOTE — Progress Notes (Signed)
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  No delayed enhancement.  Still with dependent airspace disease in lungs noted on MRI as well, likely from aspiration.    Mobilize, may be able to go home tomorrow.  Consider Corlanor if HR remains elevated in am.  Does not have BP room for GDMT.  Will need followup echo in a couple of months to make sure EF normalizes.   Audrey Callahan. 08/30/2022

## 2022-08-30 NOTE — TOC Initial Note (Addendum)
Transition of Care [facility]) - Initial/Assessment Note    Patient Details  Name: Audrey Callahan MRN: [id number] Date of Birth: 22-Jul-2001  [facility] CM/SW Contact:    Elliot Cousin, RN Phone Number: [phone] 08/30/2022, 1:26 PM  Clinical Narrative:                 Received call from [employer] and they will follow up on getting necessary paperwork completed. Rep states she will reach out to pt's mother to give her instructions on their process for completing FMLA and short term disability.   HF TOC CM spoke to pt's mother at bedside. States pt works full-time. She will give her HR a call to see if pt will need to complete FMLA or disability. Provided pt's mother with CM email/phone contact. Will continue to follow for dc needs.   Expected Discharge Plan: Home/Self Care Barriers to Discharge: Continued Medical Work up   Patient Goals and CMS Choice        Expected Discharge Plan and Services Expected Discharge Plan: Home/Self Care                                              Prior Living Arrangements/Services     Patient language and need for interpreter reviewed:: Yes Do you feel safe going back to the place where you live?: Yes            Criminal Activity/Legal Involvement Pertinent to Current Situation/Hospitalization: No - Comment as needed  Activities of Daily Living Home Assistive Devices/Equipment: Eyeglasses, Contact lenses ADL Screening (condition at time of admission) Is the patient deaf or have difficulty hearing?: No Does the patient have difficulty seeing, even when wearing glasses/contacts?: No Does the patient have difficulty concentrating, remembering, or making decisions?: No Patient able to express need for assistance with ADLs?: No Does the patient have difficulty dressing or bathing?: No Independently performs ADLs?: Yes (appropriate for developmental age) Does the patient have difficulty  walking or climbing stairs?: No Weakness of Legs: None Weakness of Arms/Hands: None  Permission Sought/Granted Permission sought to share information with : Case Manager, Family Supports, PCP Permission granted to share information with : Yes, Verbal Permission Granted  Share Information with NAME: Jakeria Caissie     Permission granted to share info w Relationship: mother  Permission granted to share info w Contact Information: [phone]  Emotional Assessment              Admission diagnosis:  Pneumonia [J18.9] Pneumonia of both lungs due to infectious organism, unspecified part of lung [J18.9] Patient Active Problem List   Diagnosis Date Noted   Acute systolic heart failure (HCC)    Acute respiratory failure with hypoxia (HCC)    Hypotension    Nasal congestion 08/28/2022   Nasal septal deviation 08/28/2022   Pneumonia 08/28/2022   PCP:  Soundra Pilon, FNP Pharmacy:   [id number] [place] Spice, [place] - [address] AT [facility] OF [address] [place] [address] Phone: [phone] Fax: [phone]  [facility] [address] [place] [address] Phone: [phone] Fax: [phone]     Social Determinants of Health (SDOH) Interventions    Readmission Risk Interventions     No data to display

## 2022-08-30 NOTE — Progress Notes (Addendum)
Advanced Heart Failure Rounding Note  PCP-Cardiologist: None   Subjective:    No events overnight. Off of NE. Stopped ~7Pm last night. SBPs upper 90s.   Co-ox 75%. CVP 5   Hs trop 881>>866. ESR nl. No CP. No dyspnea.   ST on tele, 110-120s. No arrhthymias.   Getting cMRI today  Only complaint is nasal soreness from surgery. Asking for Tylenol.    Objective:   Weight Range: 56.9 kg Body mass index is 21.53 kg/m.   Vital Signs:   Temp:  [98.3 F (36.8 C)-99.1 F (37.3 C)] 98.3 F (36.8 C) (09/15 0400) Pulse Rate:  [80-139] 99 (09/15 0500) Resp:  [20-37] 31 (09/15 0500) BP: (74-114)/(21-82) 91/59 (09/15 0500) SpO2:  [87 %-99 %] 94 % (09/15 0500) FiO2 (%):  [30 %-40 %] 40 % (09/15 0400) Weight:  [56.9 kg] 56.9 kg (09/15 0500) Last BM Date :  (PTA)  Weight change: Filed Weights   08/29/22 0030 08/29/22 0500 08/30/22 0500  Weight: 59.2 kg 59.2 kg 56.9 kg    Intake/Output:   Intake/Output Summary (Last 24 hours) at 08/30/2022 2202 Last data filed at 08/30/2022 0400 Gross per 24 hour  Intake 1981.53 ml  Output 3350 ml  Net -1368.47 ml      Physical Exam    CVP 5  General:  Well appearing, young female. No resp difficulty HEENT: Normal Neck: Supple. JVP not elevated + Rt IJ CVC Carotids 2+ bilat; no bruits. No lymphadenopathy or thyromegaly appreciated. Cor: PMI nondisplaced. Regular rhythm, tachy rate. No rubs, gallops or murmurs. Lungs: Clear Abdomen: Soft, nontender, nondistended. No hepatosplenomegaly. No bruits or masses. Good bowel sounds. Extremities: No cyanosis, clubbing, rash, edema Neuro: Alert & orientedx3, cranial nerves grossly intact. moves all 4 extremities w/o difficulty. Affect pleasant   Telemetry   Sinus tach 110s-120s   EKG    N/A   Labs    CBC Recent Labs    08/28/22 1949 08/29/22 0115  WBC 16.1* 17.6*  NEUTROABS  --  16.0*  HGB 13.7 13.5  HCT 39.8 39.9  MCV 85.6 88.5  PLT 326 309   Basic Metabolic  Panel Recent Labs    08/28/22 1949 08/29/22 0115  NA 134* 136  K 4.1 4.0  CL 100 110  CO2 22 20*  GLUCOSE 168* 164*  BUN 13 10  CREATININE 0.62 0.57  CALCIUM 8.8* 8.4*  MG  --  1.6*  PHOS  --  3.3   Liver Function Tests No results for input(s): "AST", "ALT", "ALKPHOS", "BILITOT", "PROT", "ALBUMIN" in the last 72 hours. No results for input(s): "LIPASE", "AMYLASE" in the last 72 hours. Cardiac Enzymes No results for input(s): "CKTOTAL", "CKMB", "CKMBINDEX", "TROPONINI" in the last 72 hours.  BNP: BNP (last 3 results) Recent Labs    08/29/22 0115  BNP 302.7*    ProBNP (last 3 results) No results for input(s): "PROBNP" in the last 8760 hours.   D-Dimer No results for input(s): "DDIMER" in the last 72 hours. Hemoglobin A1C No results for input(s): "HGBA1C" in the last 72 hours. Fasting Lipid Panel No results for input(s): "CHOL", "HDL", "LDLCALC", "TRIG", "CHOLHDL", "LDLDIRECT" in the last 72 hours. Thyroid Function Tests Recent Labs    08/29/22 1113  TSH 5.775*    Other results:   Imaging    DG CHEST PORT 1 VIEW  Result Date: 08/29/2022 CLINICAL DATA:  Central line placement. EXAM: PORTABLE CHEST 1 VIEW COMPARISON:  August 28, 2022 FINDINGS: Right IJ approach central venous  catheter terminates at the cavoatrial junction. Cardiomediastinal silhouette is normal. Mediastinal contours appear intact. No evidence of pneumothorax. Bilateral lower lobe predominant airspace opacities. Osseous structures are without acute abnormality. Soft tissues are grossly normal. IMPRESSION: 1. Right IJ approach central venous catheter terminates at the cavoatrial junction. 2. Bilateral lower lobe predominant airspace opacities. Electronically Signed   By: Fidela Salisbury M.D.   On: 08/29/2022 15:57   ECHOCARDIOGRAM COMPLETE  Result Date: 08/29/2022    ECHOCARDIOGRAM REPORT   Patient Name:   Audrey Callahan Date of Exam: 08/29/2022 Medical Rec #:  [id number]      Height:        64.0 in Accession #:    [id number]     Weight:       130.5 lb Date of Birth:  2001/04/21      BSA:          1.632 m Patient Age:    21 years       BP:           90/61 mmHg Patient Gender: F              HR:           119 bpm. Exam Location:  Inpatient Procedure: 2D Echo and Intracardiac Opacification Agent Indications:    Dyspnea  History:        Patient has no prior history of Echocardiogram examinations.  Sonographer:    Harvie Junior Referring Phys: [phone] MATTHEW R HUNSUCKER  Sonographer Comments: Technically difficult study due to poor echo windows and no subcostal window. Image acquisition challenging due to respiratory motion and sitting position. IMPRESSIONS  1. No mural thrombus by Definity contrast. Left ventricular ejection fraction, by estimation, is 35 to 40%. Left ventricular ejection fraction by 2D MOD biplane is 35.9 %. The left ventricle has moderately decreased function. The left ventricle demonstrates global hypokinesis. Indeterminate diastolic filling due to E-A fusion.  2. Right ventricular systolic function is hyperdynamic. The right ventricular size is normal. Tricuspid regurgitation signal is inadequate for assessing PA pressure.  3. The mitral valve is grossly normal. Trivial mitral valve regurgitation.  4. The aortic valve is tricuspid. Aortic valve regurgitation is not visualized.  5. The inferior vena cava is normal in size with greater than 50% respiratory variability, suggesting right atrial pressure of 3 mmHg. Comparison(s): No prior Echocardiogram. FINDINGS  Left Ventricle: No mural thrombus by Definity contrast. Left ventricular ejection fraction, by estimation, is 35 to 40%. Left ventricular ejection fraction by 2D MOD biplane is 35.9 %. The left ventricle has moderately decreased function. The left ventricle demonstrates global hypokinesis. Definity contrast agent was given IV to delineate the left ventricular endocardial borders. The left ventricular internal cavity size was normal  in size. There is no left ventricular hypertrophy. Indeterminate diastolic filling due to E-A fusion. Right Ventricle: The right ventricular size is normal. No increase in right ventricular wall thickness. Right ventricular systolic function is hyperdynamic. Tricuspid regurgitation signal is inadequate for assessing PA pressure. Left Atrium: Left atrial size was normal in size. Right Atrium: Right atrial size was normal in size. Pericardium: There is no evidence of pericardial effusion. Mitral Valve: The mitral valve is grossly normal. Trivial mitral valve regurgitation. Tricuspid Valve: The tricuspid valve is grossly normal. Tricuspid valve regurgitation is trivial. Aortic Valve: The aortic valve is tricuspid. Aortic valve regurgitation is not visualized. Aortic valve mean gradient measures 3.0 mmHg. Aortic valve peak gradient measures 4.6 mmHg. Aortic valve area, by VTI  measures 2.64 cm. Pulmonic Valve: The pulmonic valve was normal in structure. Pulmonic valve regurgitation is not visualized. Aorta: The aortic root and ascending aorta are structurally normal, with no evidence of dilitation. Venous: The inferior vena cava is normal in size with greater than 50% respiratory variability, suggesting right atrial pressure of 3 mmHg. IAS/Shunts: No atrial level shunt detected by color flow Doppler.  LEFT VENTRICLE PLAX 2D                        Biplane EF (MOD) LVIDd:         4.10 cm         LV Biplane EF:   Left LVIDs:         2.80 cm                          ventricular LV PW:         0.60 cm                          ejection LV IVS:        0.60 cm                          fraction by LVOT diam:     2.00 cm                          2D MOD LV SV:         47                               biplane is LV SV Index:   29                               35.9 %. LVOT Area:     3.14 cm                                Diastology                                LV e' medial:    8.38 cm/s LV Volumes (MOD)               LV E/e'  medial:  9.9 LV vol d, MOD    58.7 ml       LV e' lateral:   11.40 cm/s A2C:                           LV E/e' lateral: 7.3 LV vol d, MOD    62.0 ml A4C: LV vol s, MOD    38.4 ml A2C: LV vol s, MOD    38.2 ml A4C: LV SV MOD A2C:   20.3 ml LV SV MOD A4C:   62.0 ml LV SV MOD BP:    21.7 ml RIGHT VENTRICLE RV S prime:     17.40 cm/s TAPSE (M-mode): 2.2 cm LEFT ATRIUM             Index       RIGHT ATRIUM  Index LA diam:        2.10 cm 1.29 cm/m  RA Area:     6.30 cm LA Vol (A2C):   13.6 ml 8.33 ml/m  RA Volume:   9.96 ml  6.10 ml/m LA Vol (A4C):   15.5 ml 9.50 ml/m LA Biplane Vol: 14.7 ml 9.01 ml/m  AORTIC VALVE                    PULMONIC VALVE AV Area (Vmax):    2.72 cm     PV Vmax:       0.90 m/s AV Area (Vmean):   2.68 cm     PV Peak grad:  3.2 mmHg AV Area (VTI):     2.64 cm AV Vmax:           107.00 cm/s AV Vmean:          74.700 cm/s AV VTI:            0.177 m AV Peak Grad:      4.6 mmHg AV Mean Grad:      3.0 mmHg LVOT Vmax:         92.50 cm/s LVOT Vmean:        63.800 cm/s LVOT VTI:          0.149 m LVOT/AV VTI ratio: 0.84  AORTA Ao Root diam: 3.00 cm Ao Asc diam:  2.60 cm MITRAL VALVE MV Area (PHT): 6.60 cm    SHUNTS MV Decel Time: 115 msec    Systemic VTI:  0.15 m MR Peak grad: 18.8 mmHg    Systemic Diam: 2.00 cm MR Vmax:      217.00 cm/s MV E velocity: 83.10 cm/s MV A velocity: 23.60 cm/s MV E/A ratio:  3.52 Lyman Bishop MD Electronically signed by Lyman Bishop MD Signature Date/Time: 08/29/2022/11:43:00 AM    Final      Medications:     Scheduled Medications:  aspirin  81 mg Oral Daily   buPROPion  150 mg Oral q morning   Chlorhexidine Gluconate Cloth  6 each Topical Daily   enoxaparin (LOVENOX) injection  40 mg Subcutaneous Q24H   famotidine  20 mg Oral QHS   methylPREDNISolone (SOLU-MEDROL) injection  60 mg Intravenous Daily   mouth rinse  15 mL Mouth Rinse 4 times per day   traZODone  50 mg Oral QHS    Infusions:  sodium chloride 10 mL/hr at 08/30/22 0400   sodium  chloride 10 mL/hr at 08/30/22 0400   azithromycin Stopped (08/29/22 2315)   norepinephrine (LEVOPHED) Adult infusion Stopped (08/29/22 1855)   piperacillin-tazobactam (ZOSYN)  IV 3.375 g (08/30/22 0652)    PRN Medications: sodium chloride, acetaminophen, morphine injection, ondansetron (ZOFRAN) IV, mouth rinse, mouth rinse, oxyCODONE, polyethylene glycol    Patient Profile   21 yo female with history of anxiety and depression. No prior cardiac history. Presented with nausea, vomiting, acute hypoxemic respiratory failure after septoplasty on 08/28/22. Subsequently developed shock w/ hypotension, requiring NE. Echo EF 35-40%, normal RV, trivial MR, IVC not dilated. Transferred to [facility] for further management.   Assessment/Plan   1. Shock: Echo showed EF 35-40%, normal RV, trivial MR, IVC not dilated.  However, EF is not markedly low.  I wonder if this is not some form of vasodilatory shock related to aspiration event.  BP improved. Now off NE. SBPs soft but stable in upper 90s. PCT < 0.1, septic shock seems unlikely.  - She is on Zosyn though as  noted above septic shock seems unlikely.  - Co-ox 75% off NE. CVP 5 - cMRI today  - monitor closely  2.  Acute systolic CHF: Echo this admission with EF 35-40%, normal RV, trivial MR, IVC not dilated. BNP 303.  No cardiac history, though mother notes tachycardia with walking this summer.  She has not history of exercise intolerance.  Family history unknown (adopted).  No ETOH/drugs reported.  Has never been pregnant. Cause of cardiomyopathy uncertain => could this have been pre-existing vs stress cardiomyopathy (perhaps most likely) vs ?myocarditis/other.  Myocarditis seems like it would be an unlikely coincidence in this situation.  Hs-TnI returned at 818>>866, ?demand ischemia with hypotension versus myocarditis.  Off NE this morning. SBPs upper 90s. Co-ox 75%. CVP 5  - Cardiac MRI today to look for evidence for myocarditis or infiltrative disease.  -  GDMT when BP stabilizes.  3. Elevated HS-TnI: HS-TnI 818>>866 in setting of hypotension.  Possible demand ischemia related to hypotension or stress cardiomyopathy.  Also consider myocardial damage from myocarditis.  No chest pain and ECG is normal besides sinus tachy, doubt ACS.  - ASA 81 for now.  - Continue Lovenox prophylaxis for now.  - cMRI today  4. Acute hypoxemic respiratory failure: Hypoxemic yesterday.  CTA chest with no PE, multifocal lower lung field infiltrates.  Suspect most likely aspiration pneumonitis related to post-op nausea/vomiting.  CVP low, does not appear volume overloaded.  Cannot rule out flash pulmonary edema with rapid clearance.  EF low on echo but no MR. She is now back on room air.  - Continue Zosyn for aspiration PNA coverage.  - Continue PPI.  - Repeat CXR.   Length of Stay: 1  Brittainy Simmons, PA-C  08/30/2022, 7:12 AM  Advanced Heart Failure Team Pager [phone] (M-F; 7a - 5p)  Please contact [facility] for night-coverage after hours (5p -7a ) and weekends on [email or web address]  Patient seen with PA, agree with the above note.    She is now off norepinephrine. She remains in sinus tachy, HR 100s.  Oxygen saturation 95% this morning, dipped to 88% last night.  CVP 3.   General: NAD Neck: No JVD, no thyromegaly or thyroid nodule.  Lungs: Clear to auscultation bilaterally with normal respiratory effort. CV: Nondisplaced PMI.  Heart mildly tachy, regular S1/S2, no S3/S4, no murmur.  No peripheral edema.   Abdomen: Soft, nontender, no hepatosplenomegaly, no distention.  Skin: Intact without lesions or rashes.  Neurologic: Alert and oriented x 3.  Psych: Normal affect. Extremities: No clubbing or cyanosis.  HEENT: Normal.   Shock resolved, off NE.  HS-TnI flat trend, suspect demand ischemia from hypotension, less likely myocarditis.  - Cardiac MRI this morning as above.  - Up out of bed, ambulate.  - Advance diet with no further nausea.  - Repeat CXR.   - Can add Corlanor for the tachycardia in setting of soft BP and low EF as long as BP remains stable off pressors today.   Loralie Champagne 08/30/2022 7:50 AM

## 2022-08-30 NOTE — Progress Notes (Signed)
NAME:  Audrey Callahan, MRN:  [id number], DOB:  12/19/2000, LOS: 1 ADMISSION DATE:  08/28/2022, CONSULTATION DATE:  08/30/22 REFERRING MD:  ED doctor, CHIEF COMPLAINT:  nausea   History of Present Illness:  21 year old woman who underwent septoplasty 08/28/2022 presents to the ED with nausea vomiting and shortness of breath found to have hypoxemic respiratory failure with bilateral diffuse infiltrates on chest x-ray and CT scan concerning for ARDS due to aspiration pneumonia.  History difficult given mild encephalopathy presumably lingering effect from anesthesia.  History from patient and mother.  Prior to surgery she was fine for the respiratory issues.  No shortness of breath or dyspnea exertion.  No hypoxemic respiratory failure, hypoxemia.  After the surgery she is been very nauseated.  Vomiting at home.  Oral Zofran never helpful.  Presented to the ED.  Found to be hypoxemic.  Chest x-ray on my review interpretation reveals bilateral infiltrates concerning for multifocal pneumonia.  CTA PE protocol was obtained which showed no PE but did demonstrate diffuse nodular with more confluent infiltrates in the lower lobes with predilection for dependent areas concerning for multifocal pneumonia versus ARDS.  After discussion with ED doctor antibiotics were instituted.  She is wearing a bucket facemask given recent nasal surgery and packing.  Pertinent  Medical History  GERD, depression, insomnia  Significant Hospital Events: Including procedures, antibiotic start and stop dates in addition to other pertinent events   9/13 presents to ED 9/14 admitted to ICU with acute hypoxic respite failure related to aspiration pneumonia and ARDS.   Interim History / Subjective:  Oxygen was titrated off Coox improved to 75% Patient's mother stated that even with simple walk her heart rate goes up to 180s  Objective   Blood pressure 99/68, pulse (!) 109, temperature 98.5 F (36.9 C), temperature source Oral,  resp. rate 20, height 5\' 4"  (1.626 m), weight 56.9 kg, SpO2 94 %. CVP:  [0 mmHg-15 mmHg] 2 mmHg  FiO2 (%):  [30 %-40 %] 40 %   Intake/Output Summary (Last 24 hours) at 08/30/2022 0943 Last data filed at 08/30/2022 0800 Gross per 24 hour  Intake 2016.97 ml  Output 3450 ml  Net -1433.03 ml   Filed Weights   08/29/22 0030 08/29/22 0500 08/30/22 0500  Weight: 59.2 kg 59.2 kg 56.9 kg    Examination: Physical exam: General: Young Caucasian female, lying on the bed HEENT: [place]/AT, eyes anicteric.  moist mucus membranes Neuro: Alert, awake following commands Chest: Coarse breath sounds, no wheezes or rhonchi Heart: Regular rate and rhythm, no murmurs or gallops Abdomen: Soft, nontender, nondistended, bowel sounds present Skin: No rash   Resolved Hospital Problem list   Toxic/Metabolic Encephalopathy  Assessment & Plan:  Acute hypoxemic respiratory failure/ARDS in the setting of nausea and vomiting status post anesthesia aspiration pneumonia/pneumonitis CT chest with diffuse bilateral infiltrates worse and more dense in bilateral lower lobe infiltrates.   Continue Zosyn to complete 5 days therapy Stop the steroid Currently on room air Encourage incentive spirometry Aspiration precautions  Cultures have been negative  Shock, likely due to vasoplegia Patient required IV vasopressor support for short time Her EF is 35 to 40% but not that low to suggest cardiogenic shock. 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 Advanced heart failure team is following Remains tachycardic Serum troponin are elevated Cardiac MRI is pending Continue aspirin for now  GERD Continue  PPI  Depression Insomnia  Home medications include; Seroquel, Wellbutrin, and Trazodone  Continue home Trazodone and Wellbutrin  Monitor alertness and mentation given need for narcotics post surgery before resumption of home Seroquel  Best Practice (right click and "Reselect all SmartList Selections" daily)   Diet/type: Regular consistency (see orders) DVT prophylaxis: LMWH GI prophylaxis: N/A Lines: N/A Foley:  N/A Code Status:  full code Last date of multidisciplinary goals of care discussion: 9/15: Patient and her mother were updated at bedside    Cheri Fowler, MD [place] See Amion for pager If no response to pager, please call [phone] until 7pm After 7pm, Please call E-link [phone]

## 2022-08-30 NOTE — Progress Notes (Signed)
eLink Physician-Brief Progress Note Patient Name: Audrey Callahan DOB: 2001/02/20 MRN: [id number]   Date of Service  08/30/2022  HPI/Events of Note  Hypotension - BP = 77/52 with MAP = 61. CVP = 2. Last Hgb = 13.5.  eICU Interventions  Plan: Bolus with LR 1 liter IV over 1 hour.     Intervention Category Major Interventions: Hypotension - evaluation and management  Audrey Callahan [name] 08/30/2022, 11:05 PM

## 2022-08-31 DIAGNOSIS — I5021 Acute systolic (congestive) heart failure: Secondary | ICD-10-CM | POA: Diagnosis not present

## 2022-08-31 DIAGNOSIS — J690 Pneumonitis due to inhalation of food and vomit: Secondary | ICD-10-CM | POA: Diagnosis not present

## 2022-08-31 LAB — BASIC METABOLIC PANEL
Anion gap: 8 (ref 5–15)
BUN: 6 mg/dL (ref 6–20)
CO2: 24 mmol/L (ref 22–32)
Calcium: 8.6 mg/dL — ABNORMAL LOW (ref 8.9–10.3)
Chloride: 105 mmol/L (ref 98–111)
Creatinine, Ser: 0.55 mg/dL (ref 0.44–1.00)
GFR, Estimated: >60 mL/min (ref >60)
Glucose, Bld: 88 mg/dL (ref 70–99)
Potassium: 3.6 mmol/L (ref 3.5–5.1)
Sodium: 137 mmol/L (ref 135–145)

## 2022-08-31 LAB — COOXEMETRY PANEL
Carboxyhemoglobin: 2.5 % — ABNORMAL HIGH (ref 0.5–1.5)
Carboxyhemoglobin: 1.9 % — ABNORMAL HIGH (ref 0.5–1.5)
Methemoglobin: 0.7 % (ref 0.0–1.5)
Methemoglobin: <0.7 % (ref 0.0–1.5)
O2 Saturation: 86.9 %
O2 Saturation: 84.3 %
Total hemoglobin: 11.4 g/dL — ABNORMAL LOW (ref 12.0–16.0)
Total hemoglobin: 11.2 g/dL — ABNORMAL LOW (ref 12.0–16.0)

## 2022-08-31 MED ORDER — SODIUM CHLORIDE 0.9 % IV SOLN
500.0000 mg | INTRAVENOUS | Status: AC
  Administered 2022-08-31: 500 mg via INTRAVENOUS
  Filled 2022-08-31: qty 5

## 2022-08-31 MED ORDER — POTASSIUM CHLORIDE CRYS ER 20 MEQ PO TBCR
40.0000 meq | EXTENDED_RELEASE_TABLET | Freq: Once | ORAL | Status: AC
  Administered 2022-08-31: 40 meq via ORAL
  Filled 2022-08-31: qty 2

## 2022-08-31 MED ORDER — AMOXICILLIN-POT CLAVULANATE 875-125 MG PO TABS
1.0000 | ORAL_TABLET | Freq: Two times a day (BID) | ORAL | Status: DC
  Administered 2022-08-31 – 2022-09-01 (×3): 1 via ORAL
  Filled 2022-08-31 (×4): qty 1

## 2022-08-31 MED ORDER — METOPROLOL SUCCINATE ER 25 MG PO TB24
25.0000 mg | ORAL_TABLET | Freq: Every day | ORAL | Status: DC
  Administered 2022-08-31 – 2022-09-01 (×2): 25 mg via ORAL
  Filled 2022-08-31 (×2): qty 1

## 2022-08-31 NOTE — Progress Notes (Signed)
[facility] ADULT ICU REPLACEMENT PROTOCOL   The patient does apply for the [facility] Adult ICU Electrolyte Replacment Protocol based on the criteria listed below:   1.Exclusion criteria: TCTS patients, ECMO patients, and Dialysis patients 2. Is GFR >/= 30 ml/min? Yes.    Patient's GFR today is > 60 3. Is SCr </= 2? Yes.   Patient's SCr is 0.55 mg/dL 4. Did SCr increase >/= 0.5 in 24 hours? No. 5.Pt's weight >40kg  Yes.   6. Abnormal electrolyte(s): K= 3.6  7. Electrolytes replaced per protocol 8.  Call MD STAT for K+ </= 2.5, Phos </= 1, or Mag </= 1 Physician:  Dr Gavin Potters, Elfredia Nevins 08/31/2022 4:41 AM

## 2022-08-31 NOTE — Progress Notes (Signed)
NAME:  Audrey Callahan, MRN:  [id number], DOB:  [birth year], LOS: 2 ADMISSION DATE:  08/28/2022, CONSULTATION DATE:  08/31/22 REFERRING MD:  ED doctor, CHIEF COMPLAINT:  nausea   History of Present Illness:  21 year old woman who underwent septoplasty 08/28/2022 presents to the ED with nausea vomiting and shortness of breath found to have hypoxemic respiratory failure with bilateral diffuse infiltrates on chest x-ray and CT scan concerning for ARDS due to aspiration pneumonia.  History difficult given mild encephalopathy presumably lingering effect from anesthesia.  History from patient and mother.  Prior to surgery she was fine for the respiratory issues.  No shortness of breath or dyspnea exertion.  No hypoxemic respiratory failure, hypoxemia.  After the surgery she is been very nauseated.  Vomiting at home.  Oral Zofran never helpful.  Presented to the ED.  Found to be hypoxemic.  Chest x-ray on my review interpretation reveals bilateral infiltrates concerning for multifocal pneumonia.  CTA PE protocol was obtained which showed no PE but did demonstrate diffuse nodular with more confluent infiltrates in the lower lobes with predilection for dependent areas concerning for multifocal pneumonia versus ARDS.  After discussion with ED doctor antibiotics were instituted.  She is wearing a bucket facemask given recent nasal surgery and packing.  Pertinent  Medical History  GERD, depression, insomnia  Significant Hospital Events: Including procedures, antibiotic start and stop dates in addition to other pertinent events   9/13 presents to ED 9/14 admitted to ICU with acute hypoxic respite failure related to aspiration pneumonia and ARDS.   Interim History / Subjective:  Room air, BP soft, gets tachy with minimal exertion  Objective   Blood pressure 98/73, pulse 94, temperature 98.2 F (36.8 C), temperature source Oral, resp. rate (!) 21, height 5\' 4"  (1.626 m), weight 55.9 kg, SpO2 95 %. CVP:  [0  mmHg-8 mmHg] 1 mmHg      Intake/Output Summary (Last 24 hours) at 08/31/2022 1330 Last data filed at 08/31/2022 0800 Gross per 24 hour  Intake 988.55 ml  Output 1500 ml  Net -511.45 ml    Filed Weights   08/29/22 0500 08/30/22 0500 08/31/22 0500  Weight: 59.2 kg 56.9 kg 55.9 kg    Examination: Physical exam: General: Young Caucasian female, lying on the bed HEENT: [place]/AT, eyes anicteric.  moist mucus membranes Neuro: Alert, awake following commands Chest: Coarse breath sounds, no wheezes or rhonchi Heart: Regular rate and rhythm, no murmurs or gallops Abdomen: Soft, nontender, nondistended, bowel sounds present Skin: No rash   Resolved Hospital Problem list   Toxic/Metabolic Encephalopathy  Assessment & Plan:  Acute hypoxemic respiratory failure/ARDS in the setting of nausea and vomiting status post anesthesia aspiration pneumonia/pneumonitis CT chest with diffuse bilateral infiltrates worse and more dense in bilateral lower lobe infiltrates.   S/p 3 days azithromycin, transition zosyn to augmentin to complete 7 day course Currently on room air Encourage incentive spirometry Aspiration precautions  Cultures have been negative  Hypotension, likely due to vasoplegia vs normal physiology in young thin female Patient required IV vasopressor support for short time Her EF is 35 to 40% but not that low to suggest cardiogenic shock  Acute systolic congestive heart failure Patient echocardiogram suggestive of EF 35 to 40% with normal RV EF 45% on MRI, suggestive of stress cardiomyopathy Advanced heart failure team is following Trial metoprolol and ivabridine  Inappropriate tachycardia: query POTS as preceded recent illness Appreciate cardiology assistance  GERD Continue PPI  Depression Insomnia  Home medications include; Seroquel, Wellbutrin,  and Trazodone  Continue home Trazodone and Wellbutrin   Best Practice (right click and "Reselect all SmartList Selections"  daily)   Diet/type: Regular consistency (see orders) DVT prophylaxis: LMWH GI prophylaxis: N/A Lines: N/A Foley:  N/A Code Status:  full code Last date of multidisciplinary goals of care discussion: 9/16: Patient and her mother were updated at bedside    Larey Days MD [place] for pager If no response to pager, please call [phone] until 7pm After 7pm, Please call E-link [phone]

## 2022-08-31 NOTE — Progress Notes (Signed)
Advanced Heart Failure Rounding Note  PCP-Cardiologist: None   Subjective:    Off NE. Co-ox 84% SBP in 90s.   Denies CP or SOB. HR remains fast 105-110 at rest. 140 with walking (says this is chronic for her). Was in 80s while sleeping  cMRI 9/15 1. Normal LV size witih mild systolic dysfunction, EF 46%. There does appear to be mild hypokinesis in all the mid ventricular wall segments. 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 2.  Low normal to mildly reduced RV systolic function, EF 48%. 3.  Normal T1 and T2 indices. 4. No myocardial LGE, so no evidence for prior MI, infiltrative disease, or myocarditis.   Objective:   Weight Range: 55.9 kg Body mass index is 21.15 kg/m.   Vital Signs:   Temp:  [98.2 F (36.8 C)-98.6 F (37 C)] 98.2 F (36.8 C) (09/16 0800) Pulse Rate:  [79-125] 110 (09/16 0900) Resp:  [16-32] 23 (09/16 0900) BP: (76-106)/(49-82) 91/70 (09/16 0900) SpO2:  [88 %-97 %] 95 % (09/16 0900) Weight:  [55.9 kg] 55.9 kg (09/16 0500) Last BM Date : 08/26/22  Weight change: Filed Weights   08/29/22 0500 08/30/22 0500 08/31/22 0500  Weight: 59.2 kg 56.9 kg 55.9 kg    Intake/Output:   Intake/Output Summary (Last 24 hours) at 08/31/2022 1031 Last data filed at 08/31/2022 0800 Gross per 24 hour  Intake 1108.55 ml  Output 2200 ml  Net -1091.45 ml       Physical Exam    General:  Well appearing. No resp difficulty HEENT: normal Neck: supple RIJ TLC. no JVD. Carotids 2+ bilat; no bruits. No lymphadenopathy or thryomegaly appreciated. Cor: PMI nondisplaced. Tachy. Regular No rubs, gallops or murmurs. Lungs: clear Abdomen: soft, nontender, nondistended. No hepatosplenomegaly. No bruits or masses. Good bowel sounds. Extremities: no cyanosis, clubbing, rash, edema Neuro: alert & orientedx3, cranial nerves grossly intact. moves all 4 extremities w/o difficulty. Affect pleasant   Telemetry   Sinus tach 100-110 at  rest. 140 with walking. 80s while sleepingPersonally reviewed  Labs    CBC Recent Labs    08/28/22 1949 08/29/22 0115  WBC 16.1* 17.6*  NEUTROABS  --  16.0*  HGB 13.7 13.5  HCT 39.8 39.9  MCV 85.6 88.5  PLT 326 309    Basic Metabolic Panel Recent Labs    43/15/40 0115 08/30/22 0453 08/30/22 0655 08/31/22 0316  NA 136 137  --  137  K 4.0 3.8  --  3.6  CL 110 107  --  105  CO2 20* 22  --  24  GLUCOSE 164* 98  --  88  BUN 10 9  --  6  CREATININE 0.57 0.57  --  0.55  CALCIUM 8.4* 8.2*  --  8.6*  MG 1.6*  --  2.1  --   PHOS 3.3  --   --   --     Liver Function Tests No results for input(s): "AST", "ALT", "ALKPHOS", "BILITOT", "PROT", "ALBUMIN" in the last 72 hours. No results for input(s): "LIPASE", "AMYLASE" in the last 72 hours. Cardiac Enzymes No results for input(s): "CKTOTAL", "CKMB", "CKMBINDEX", "TROPONINI" in the last 72 hours.  BNP: BNP (last 3 results) Recent Labs    08/29/22 0115  BNP 302.7*     ProBNP (last 3 results) No results for input(s): "PROBNP" in the last 8760 hours.   D-Dimer No results for input(s): "DDIMER" in the last 72 hours. Hemoglobin A1C No results for input(s): "HGBA1C" in the last 72  hours. Fasting Lipid Panel No results for input(s): "CHOL", "HDL", "LDLCALC", "TRIG", "CHOLHDL", "LDLDIRECT" in the last 72 hours. Thyroid Function Tests Recent Labs    08/29/22 1113  TSH 5.775*     Other results:   Imaging    MR CARDIAC MORPHOLOGY W WO CONTRAST  Result Date: 08/30/2022 CLINICAL DATA:  Cardiomyopathy of uncertain etiology EXAM: CARDIAC MRI TECHNIQUE: The patient was scanned on a 1.5 Tesla GE magnet. A dedicated cardiac coil was used. Functional imaging was done using Fiesta sequences. 2,3, and 4 chamber views were done to assess for RWMA's. Modified Simpson's rule using a short axis stack was used to calculate an ejection fraction on a dedicated work Conservation officer, nature. The patient received 8 cc of Gadavist.  After 10 minutes inversion recovery sequences were used to assess for infiltration and scar tissue. CONTRAST:  Gadavist 8 cc FINDINGS: Limited images of the lung fields showed bilateral dependent airspace disease as noted on recent CT chest. Normal left ventricular size and wall thickness. There appeared to be mild hypokinesis in all the mid ventricular wall segments, EF 46%. Normal right ventricular size with low normal to mildly decreased systolic function (EF 64%). Normal right and left atrial sizes. Trileaflet aortic valve with no stenosis or regurgitation. No significant mitral regurgitation noted. On delayed enhancement imaging, there was no myocardial late gadolinium enhancement (LGE). MEASUREMENTS: MEASUREMENTS LVEDV 110 mL LVEDVi 69 mL/m2 LVSV 50 mL LVEF 46% RVEDV 92 mL RVEDVi 57 mL/m2 RVSV 45 mL RVEF 48% T1 1047, ECV 26% T2 51 IMPRESSION: 1. Normal LV size witih mild systolic dysfunction, EF 40%. There does appear to be mild hypokinesis in all the mid ventricular wall segments. 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 2.  Low normal to mildly reduced RV systolic function, EF 34%. 3.  Normal T1 and T2 indices. 4. No myocardial LGE, so no evidence for prior MI, infiltrative disease, or myocarditis. Dalton Mclean Electronically Signed   By: Loralie Champagne M.D.   On: 08/30/2022 16:48     Medications:     Scheduled Medications:  amoxicillin-clavulanate  1 tablet Oral Q12H   buPROPion  150 mg Oral q morning   Chlorhexidine Gluconate Cloth  6 each Topical Daily   enoxaparin (LOVENOX) injection  40 mg Subcutaneous Q24H   famotidine  20 mg Oral QHS   mouth rinse  15 mL Mouth Rinse 4 times per day   sodium chloride  2 spray Each Nare Q2H while awake   traZODone  50 mg Oral QHS    Infusions:  sodium chloride Stopped (08/30/22 2319)   sodium chloride Stopped (08/31/22 0557)   azithromycin 500 mg (08/31/22 0947)    PRN Medications: sodium chloride, acetaminophen,  morphine injection, ondansetron (ZOFRAN) IV, mouth rinse, mouth rinse, oxyCODONE, oxymetazoline, polyethylene glycol    Patient Profile   21 yo female with history of anxiety and depression. No prior cardiac history. Presented with nausea, vomiting, acute hypoxemic respiratory failure after septoplasty on 08/28/22. Subsequently developed shock w/ hypotension, requiring NE. Echo EF 35-40%, normal RV, trivial MR, IVC not dilated. Transferred to [facility] for further management.   Assessment/Plan   1. Shock: Echo showed EF 35-40%, normal RV, trivial MR, IVC not dilated. SBPs soft but stable in upper 90s. PCT < 0.1, septic shock seems unlikely.  - She is on Zosyn though as noted above septic shock seems unlikely.  - Now hemodynamically stable - Co-ox 84% off NE. 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  Acute systolic CHF: Echo this admission with EF 35-40%, normal RV, trivial MR, IVC not dilated. BNP 303.  No cardiac history, though mother notes tachycardia with walking this summer.  She has not history of exercise intolerance.  Family history unknown (adopted).  No ETOH/drugs reported.  Has never been pregnant. Cause of cardiomyopathy uncertain => MRI and elevated Tn suggests Tako-tsubo like event in setting of stress/anesthesia Off NE this morning. SBPs upper 90s. Co-ox 84%. 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 Given tako-tsubo and elevated Tn, will add low dose Toprol 25 qhs. Can add ivabradine as needed  3. Elevated HS-TnI: HS-TnI 818>>866 in setting of hypotension.  Possible demand ischemia related to hypotension or stress cardiomyopathy.  Also consider myocardial damage from myocarditis.  No chest pain and ECG is normal besides sinus tachy, doubt ACS. cMRI as above. Suspect stress-induced CM - ASA 81 for now. Will add low dose Toprol  25 qhs  4. Acute hypoxemic respiratory failure: Hypoxemic on admit CTA chest with no PE, multifocal lower lung field infiltrates.  Suspect most likely aspiration pneumonitis related to post-op nausea/vomiting.  CVP low, does not appear volume overloaded.  - Abx for aspiration PNA coverage. Per CCM   5. Sinus tach - chronic issue for her. Suspect potential underlying POTS or similar autonomic issues - TSH/FT4/T3 all mildly elevated. I d/w Endo feel may be related to stress. Also in differential is TSH-secreting tumor vs partial tissue resistance to thyroid hormone. Recommend rechecking 4-6 weeks. If still elevated consider head & neck imaging  Can go to floor. Would watch one more day. D/w Dr. Judeth Horn.   Length of Stay: 2  Arvilla Meres, MD  08/31/2022, 10:31 AM  Advanced Heart Failure Team Pager [phone] (M-F; 7a - 5p)  Please contact [facility] for night-coverage after hours (5p -7a ) and weekends on [email or web address]

## 2022-09-01 DIAGNOSIS — I5021 Acute systolic (congestive) heart failure: Secondary | ICD-10-CM | POA: Diagnosis not present

## 2022-09-01 DIAGNOSIS — J690 Pneumonitis due to inhalation of food and vomit: Secondary | ICD-10-CM | POA: Diagnosis not present

## 2022-09-01 MED ORDER — AMOXICILLIN-POT CLAVULANATE 875-125 MG PO TABS: 1.0000 | ORAL_TABLET | Freq: Two times a day (BID) | ORAL | 0 refills | Status: DC

## 2022-09-01 MED ORDER — METOPROLOL SUCCINATE ER 25 MG PO TB24: 25.0000 mg | ORAL_TABLET | Freq: Every day | ORAL | 1 refills | Status: DC

## 2022-09-01 NOTE — Discharge Instructions (Signed)
(  HEART FAILURE PATIENTS) Call MD:  Anytime you have any of the following symptoms: 1) 3 pound weight gain in 24 hours or 5 pounds in 1 week 2) shortness of breath, with or without a dry hacking cough 3) swelling in the hands, feet or stomach 4) if you have to sleep on extra pillows at night in order to breathe.

## 2022-09-01 NOTE — Progress Notes (Signed)
Advanced Heart Failure Rounding Note  PCP-Cardiologist: None   Subjective:    Toprol 25 started yesterday. Tachycardia improved. Denies CP or SOB.    cMRI 9/15 1. Normal LV size witih mild systolic dysfunction, EF 46%. There does appear to be mild hypokinesis in all the mid ventricular wall segments. 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 2.  Low normal to mildly reduced RV systolic function, EF 48%. 3.  Normal T1 and T2 indices. 4. No myocardial LGE, so no evidence for prior MI, infiltrative disease, or myocarditis.   Objective:   Weight Range: 54.7 kg Body mass index is 20.68 kg/m.   Vital Signs:   Temp:  [98 F (36.7 C)-98.8 F (37.1 C)] 98.3 F (36.8 C) (09/17 0816) Pulse Rate:  [70-111] 100 (09/17 1020) Resp:  [17-27] 19 (09/17 0600) BP: (85-104)/(50-79) 95/69 (09/17 1020) SpO2:  [90 %-98 %] 95 % (09/17 0600) Weight:  [54.7 kg] 54.7 kg (09/17 0500) Last BM Date : 08/26/22  Weight change: Filed Weights   08/30/22 0500 08/31/22 0500 09/01/22 0500  Weight: 56.9 kg 55.9 kg 54.7 kg    Intake/Output:   Intake/Output Summary (Last 24 hours) at 09/01/2022 1023 Last data filed at 08/31/2022 1100 Gross per 24 hour  Intake 9.67 ml  Output --  Net 9.67 ml       Physical Exam    General:  Well appearing. No resp difficulty HEENT: normal Neck: supple. no JVD. Carotids 2+ bilat; no bruits. No lymphadenopathy or thryomegaly appreciated. Cor: PMI nondisplaced. Regular rate & rhythm. No rubs, gallops or murmurs. Lungs: clear Abdomen: soft, nontender, nondistended. No hepatosplenomegaly. No bruits or masses. Good bowel sounds. Extremities: no cyanosis, clubbing, rash, edema Neuro: alert & orientedx3, cranial nerves grossly intact. moves all 4 extremities w/o difficulty. Affect pleasant   Telemetry   Sinus 70-90 at rest. 120-140 with walking.Personally reviewed  Labs    CBC No results for input(s): "WBC", "NEUTROABS",  "HGB", "HCT", "MCV", "PLT" in the last 72 hours.  Basic Metabolic Panel Recent Labs    94/76/54 0453 08/30/22 0655 08/31/22 0316  NA 137  --  137  K 3.8  --  3.6  CL 107  --  105  CO2 22  --  24  GLUCOSE 98  --  88  BUN 9  --  6  CREATININE 0.57  --  0.55  CALCIUM 8.2*  --  8.6*  MG  --  2.1  --     Liver Function Tests No results for input(s): "AST", "ALT", "ALKPHOS", "BILITOT", "PROT", "ALBUMIN" in the last 72 hours. No results for input(s): "LIPASE", "AMYLASE" in the last 72 hours. Cardiac Enzymes No results for input(s): "CKTOTAL", "CKMB", "CKMBINDEX", "TROPONINI" in the last 72 hours.  BNP: BNP (last 3 results) Recent Labs    08/29/22 0115  BNP 302.7*     ProBNP (last 3 results) No results for input(s): "PROBNP" in the last 8760 hours.   D-Dimer No results for input(s): "DDIMER" in the last 72 hours. Hemoglobin A1C No results for input(s): "HGBA1C" in the last 72 hours. Fasting Lipid Panel No results for input(s): "CHOL", "HDL", "LDLCALC", "TRIG", "CHOLHDL", "LDLDIRECT" in the last 72 hours. Thyroid Function Tests Recent Labs    08/29/22 1113  TSH 5.775*     Other results:   Imaging    No results found.   Medications:     Scheduled Medications:  amoxicillin-clavulanate  1 tablet Oral Q12H   buPROPion  150 mg Oral q  morning   Chlorhexidine Gluconate Cloth  6 each Topical Daily   enoxaparin (LOVENOX) injection  40 mg Subcutaneous Q24H   famotidine  20 mg Oral QHS   metoprolol succinate  25 mg Oral Daily   mouth rinse  15 mL Mouth Rinse 4 times per day   sodium chloride  2 spray Each Nare Q2H while awake   traZODone  50 mg Oral QHS    Infusions:  sodium chloride Stopped (08/30/22 2319)   sodium chloride Stopped (08/31/22 1058)    PRN Medications: sodium chloride, acetaminophen, morphine injection, ondansetron (ZOFRAN) IV, mouth rinse, mouth rinse, oxyCODONE, oxymetazoline, polyethylene glycol    Patient Profile   21 yo female  with history of anxiety and depression. No prior cardiac history. Presented with nausea, vomiting, acute hypoxemic respiratory failure after septoplasty on 08/28/22. Subsequently developed shock w/ hypotension, requiring NE. Echo EF 35-40%, normal RV, trivial MR, IVC not dilated. Transferred to [facility] for further management.   Assessment/Plan   1. Shock: Echo showed EF 35-40%, normal RV, trivial MR, IVC not dilated. SBPs soft but stable in upper 90s. PCT < 0.1, septic shock seems unlikely.  - cMRI EF 46% possible tako-tsubo vs anesthetic reaction - resolved  2.  Acute systolic CHF: Echo this admission with EF 35-40%, normal RV, trivial MR, IVC not dilated. BNP 303.  No cardiac history, though mother notes tachycardia with walking this summer.  She has not history of exercise intolerance.  Family history unknown (adopted).  No ETOH/drugs reported.  Has never been pregnant. Cause of cardiomyopathy uncertain => MRI and elevated Tn suggests Tako-tsubo like event in setting of stress/anesthesia Off NE this morning. SBPs upper 90s. Co-ox 84%. 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 Given tako-tsubo and elevated Tn, have added low dose Toprol 25 qhs.  3. Elevated HS-TnI: HS-TnI 818>>866 in setting of hypotension.  Possible demand ischemia related to hypotension or stress cardiomyopathy.  Also consider myocardial damage from myocarditis.  No chest pain and ECG is normal besides sinus tachy, doubt ACS. cMRI as above. Suspect stress-induced CM - ASA 81 for now. Continue low dose Toprol 25 qhs - SBP low 90s so unable to add further GDMT. Consider low-dose spiro or losartan as outpatient  4. Acute hypoxemic respiratory failure: Hypoxemic on admit CTA chest with no PE, multifocal lower lung field infiltrates.  Suspect most likely aspiration pneumonitis related  to post-op nausea/vomiting.  CVP low, does not appear volume overloaded.  - Abx for aspiration PNA coverage. Per CCM   5. Sinus tach - chronic issue for her. Suspect potential underlying POTS or similar autonomic issues - TSH/FT4/T3 all mildly elevated. I d/w Endo feel may be related to stress. Also in differential is TSH-secreting tumor vs partial tissue resistance to thyroid hormone. Recommend rechecking 4-6 weeks. If still elevated consider head & neck imaging - Improved with Toprol 25 will continue   [place] for d/c arrange f/u in HF Clinic  Length of Stay: 3  Glori Bickers, MD  09/01/2022, 10:23 AM  Advanced Heart Failure Team Pager [phone] (M-F; 7a - 5p)  Please contact [facility] for night-coverage after hours (5p -7a ) and weekends on [email or web address]

## 2022-09-01 NOTE — Plan of Care (Signed)
Problem: Education: Goal: Knowledge of General Education information will improve Description: Including pain rating scale, medication(s)/side effects and non-pharmacologic comfort measures Outcome: Adequate for Discharge   Problem: Health Behavior/Discharge Planning: Goal: Ability to manage health-related needs will improve Outcome: Adequate for Discharge   Problem: Clinical Measurements: Goal: Ability to maintain clinical measurements within normal limits will improve Outcome: Adequate for Discharge Goal: Will remain free from infection Outcome: Adequate for Discharge Goal: Diagnostic test results will improve Outcome: Adequate for Discharge Goal: Respiratory complications will improve Outcome: Adequate for Discharge Goal: Cardiovascular complication will be avoided Outcome: Adequate for Discharge   Problem: Activity: Goal: Risk for activity intolerance will decrease Outcome: Adequate for Discharge   Problem: Nutrition: Goal: Adequate nutrition will be maintained Outcome: Adequate for Discharge   Problem: Coping: Goal: Level of anxiety will decrease Outcome: Adequate for Discharge   Problem: Elimination: Goal: Will not experience complications related to bowel motility Outcome: Adequate for Discharge Goal: Will not experience complications related to urinary retention Outcome: Adequate for Discharge   Problem: Pain Managment: Goal: General experience of comfort will improve Outcome: Adequate for Discharge   Problem: Safety: Goal: Ability to remain free from injury will improve Outcome: Adequate for Discharge   Problem: Skin Integrity: Goal: Risk for impaired skin integrity will decrease Outcome: Adequate for Discharge   Problem: Education: Goal: Knowledge of General Education information will improve Description: Including pain rating scale, medication(s)/side effects and non-pharmacologic comfort measures 09/01/2022 1111 by Fuller Plan, RN Outcome:  Adequate for Discharge 09/01/2022 1109 by Fuller Plan, RN Outcome: Adequate for Discharge   Problem: Health Behavior/Discharge Planning: Goal: Ability to manage health-related needs will improve 09/01/2022 1111 by Fuller Plan, RN Outcome: Adequate for Discharge 09/01/2022 1109 by Fuller Plan, RN Outcome: Adequate for Discharge   Problem: Clinical Measurements: Goal: Ability to maintain clinical measurements within normal limits will improve 09/01/2022 1111 by Fuller Plan, RN Outcome: Adequate for Discharge 09/01/2022 1109 by Fuller Plan, RN Outcome: Adequate for Discharge Goal: Will remain free from infection 09/01/2022 1111 by Fuller Plan, RN Outcome: Adequate for Discharge 09/01/2022 1109 by Fuller Plan, RN Outcome: Adequate for Discharge Goal: Diagnostic test results will improve 09/01/2022 1111 by Fuller Plan, RN Outcome: Adequate for Discharge 09/01/2022 1109 by Fuller Plan, RN Outcome: Adequate for Discharge Goal: Respiratory complications will improve 09/01/2022 1111 by Fuller Plan, RN Outcome: Adequate for Discharge 09/01/2022 1109 by Fuller Plan, RN Outcome: Adequate for Discharge Goal: Cardiovascular complication will be avoided 09/01/2022 1111 by Fuller Plan, RN Outcome: Adequate for Discharge 09/01/2022 1109 by Yeraldi Fidler, Otila Back, RN Outcome: Adequate for Discharge   Problem: Activity: Goal: Risk for activity intolerance will decrease 09/01/2022 1111 by Fuller Plan, RN Outcome: Adequate for Discharge 09/01/2022 1109 by Fuller Plan, RN Outcome: Adequate for Discharge   Problem: Nutrition: Goal: Adequate nutrition will be maintained 09/01/2022 1111 by Fuller Plan, RN Outcome: Adequate for Discharge 09/01/2022 1109 by Fuller Plan, RN Outcome: Adequate for Discharge   Problem: Coping: Goal: Level of anxiety will decrease 09/01/2022 1111 by Fuller Plan,  RN Outcome: Adequate for Discharge 09/01/2022 1109 by Fuller Plan, RN Outcome: Adequate for Discharge   Problem: Elimination: Goal: Will not experience complications related to bowel motility 09/01/2022 1111 by Fuller Plan, RN Outcome: Adequate for Discharge 09/01/2022 1109 by Fuller Plan, RN Outcome: Adequate for Discharge Goal: Will not experience complications related to urinary retention 09/01/2022 1111  by Celso Sickle, RN Outcome: Adequate for Discharge 09/01/2022 1109 by Celso Sickle, RN Outcome: Adequate for Discharge   Problem: Pain Managment: Goal: General experience of comfort will improve 09/01/2022 1111 by Celso Sickle, RN Outcome: Adequate for Discharge 09/01/2022 1109 by Graci Hulce, Carin Hock, RN Outcome: Adequate for Discharge   Problem: Safety: Goal: Ability to remain free from injury will improve 09/01/2022 1111 by Tallula Grindle, Carin Hock, RN Outcome: Adequate for Discharge 09/01/2022 1109 by Celso Sickle, RN Outcome: Adequate for Discharge   Problem: Skin Integrity: Goal: Risk for impaired skin integrity will decrease 09/01/2022 1111 by Celso Sickle, RN Outcome: Adequate for Discharge 09/01/2022 1109 by Marianela Mandrell, Carin Hock, RN Outcome: Adequate for Discharge

## 2022-09-01 NOTE — Discharge Summary (Signed)
Physician Discharge Summary   Patient ID: Audrey Callahan [id number] 21 y.o. Dec 29, 2000  Admit date: 08/28/2022  Discharge date and time: 09/01/22 9:47 AM    Admitting Physician: Karren Burly, MD   Discharge Physician: Karren Burly, MD   Admission Diagnoses: Pneumonia [J18.9] Pneumonia of both lungs due to infectious organism, unspecified part of lung [J18.9]  Discharge Diagnoses: Principal Problem:   Pneumonia Active Problems:   Hypotension   Acute systolic heart failure (HCC)   Acute respiratory failure with hypoxia (HCC) Abnormal TSH levels   Admission Condition: critical  Discharged Condition: fair  Indication for Admission: Acute hypoxemic respiratory failure  Hospital Course:   Patient presented to the ED shortly after nasal septoplasty with shortness of breath.  Found to be hypoxemic.  Requiring significant oxygen, up to 6% high flow via bucket.  Avoiding nasal cannula and mask in the setting of nasal packing and recent septoplasty.  Initial lab work was fairly unremarkable.  Procalcitonin negative.  No white count.  She had chest x-ray that demonstrated diffuse bilateral infiltrates.  CT chest PE protocol demonstrated no PE but did demonstrate bilateral lower lobe predominant but diffuse infiltrates with significant consolidation in the bilateral lower lobes, all infiltrates with predilection for dependent areas.  This prompted checking a BNP which returned elevated over 300.  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  Heart failure service was consulted.  She was transferred to [facility].  Serial co-oximetry and CVP's were checked.  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  She continued to have inappropriate tachycardia due to minimal exertion.  Per reports this predated her admission.  Cardiology started metoprolol 25 mg daily to help with this which is prescribed on  discharge.  Plan for ongoing cardiology follow-up.  Referral to heart failure clinic was placed.    The etiology of her hypoxemic respiratory failure and bilateral infiltrates most likely led to aspiration of gastric contents due to preceding nausea and vomiting after surgery.  However, given the dependent nature of the infiltrates as well as new cardiomyopathy, pulmonary edema is considered.  She was treated with IV Zosyn and subsequent transition to Augmentin.  She is to complete a 7-day course total of antibiotics with Augmentin as an outpatient.  She was weaned to room air prior to discharge.  In addition, elevated TSH as well as T3 and T4.  This could be related to sick thyroid but given elevations in both TSH and T3 and T4, it is highly recommended these are repeated in 4 to 6 weeks.  A referral endocrinology was also placed for further evaluation.  Consults: cardiology and pulmonary/intensive care  Significant Diagnostic Studies: Reduced EF on TTE, reduced EF on cardiac MRI  Treatments: IV and oral antibiotics, beta-blockers  Discharge Exam: BP 102/67   Pulse 70   Temp 98.3 F (36.8 C) (Oral)   Resp 19   Ht 5\' 4"  (1.626 m)   Wt 54.7 kg   SpO2 95%   BMI 20.68 kg/m   General Appearance:    Alert, cooperative, no distress, appears stated age  Head:    Normocephalic, without obvious abnormality, atraumatic  Eyes:     conjunctiva/corneas clear, EOM's intact,         Throat:   Lips, mucosa, and tongue normal; teeth and gums normal  Neck:   Supple, no  JVD     Lungs:   Normal work of breathing,  on room air,  Chest Wall:    No deformity   Heart:  Warm, no edema     Abdomen:     Soft, bowel sounds present        Extremities:   Extremities normal, atraumatic, no cyanosis or edema  Pulses:   2+ and symmetric all extremities  Skin:   Skin color, texture, turgor normal, no rashes or lesions     Neurologic:   CNII-XII intact, normal strength, sensation and reflexes    throughout     Disposition: Discharge disposition: 01-Home or Self Care       Patient Instructions:  Allergies as of 09/01/2022       Reactions   Lactose Intolerance (gi)    Upset stomach    Lidocaine Viscous Hcl Nausea And Vomiting   Red Dye Nausea Only, Rash        Medication List     TAKE these medications    acetaminophen 325 MG tablet Commonly known as: TYLENOL Take 650 mg by mouth every 6 (six) hours as needed for moderate pain.   amoxicillin-clavulanate 875-125 MG tablet Commonly known as: AUGMENTIN Take 1 tablet by mouth every 12 (twelve) hours.   buPROPion 150 MG 24 hr tablet Commonly known as: WELLBUTRIN XL Take 150 mg by mouth every morning.   diphenhydrAMINE 25 MG tablet Commonly known as: BENADRYL Take 25 mg by mouth every 6 (six) hours as needed for allergies.   docusate sodium 100 MG capsule Commonly known as: Colace Take 1 capsule (100 mg total) by mouth 2 (two) times daily as needed for up to 10 days. What changed: reasons to take this   drospirenone-ethinyl estradiol 3-0.02 MG tablet Commonly known as: YAZ Take 1 tablet by mouth daily.   famotidine 20 MG tablet Commonly known as: PEPCID Take 20 mg by mouth at bedtime.   ferrous sulfate 325 (65 FE) MG tablet Take 325 mg by mouth daily.   HYDROcodone-acetaminophen 5-325 MG tablet Commonly known as: NORCO/VICODIN Take 1 tablet by mouth every 6 (six) hours as needed for up to 5 days for moderate pain.   ibuprofen 200 MG tablet Commonly known as: ADVIL Take 400 mg by mouth every 6 (six) hours as needed for moderate pain.   lactase 3000 units tablet Commonly known as: LACTAID Take 3,000 Units by mouth daily as needed (lactose intolerance).   Melatonin 5 MG Caps Take 5 mg by mouth at bedtime as needed (sleep).   metoprolol succinate 25 MG 24 hr tablet Commonly known as: TOPROL-XL Take 1 tablet (25 mg total) by mouth daily.   omeprazole 20 MG tablet Commonly known as: PRILOSEC OTC Take 20  mg by mouth daily as needed (acid reflux).   ondansetron 4 MG tablet Commonly known as: Zofran Take 1 tablet (4 mg total) by mouth every 8 (eight) hours as needed for up to 7 days for nausea or vomiting.   PROBIOTIC PO Take 1 capsule by mouth daily.   QUEtiapine 100 MG tablet Commonly known as: SEROQUEL Take 100 mg by mouth at bedtime. Take with 50 mg to equal 150 mg at bedtime   QUEtiapine 50 MG tablet Commonly known as: SEROQUEL Take 50 mg by mouth at bedtime. Take with 100 mg to equal 150 mg at bedtime   traZODone 50 MG tablet Commonly known as: DESYREL Take 50 mg by mouth at bedtime.   Vitamin D 50 MCG (2000 UT) tablet Take 2,000 Units by mouth daily.  Activity: activity as tolerated Diet: regular diet Wound Care: none needed  Follow-up with heart failure clinic in 4 to 6 weeks, follow-up with PCP in 4 weeks.  Signed: Lesia Sago Janete Quilling MD 09/01/2022 9:47 AM

## 2022-09-03 LAB — CULTURE, BLOOD (ROUTINE X 2)
Culture: NO GROWTH
Culture: NO GROWTH
Special Requests: ADEQUATE
Special Requests: ADEQUATE

## 2022-09-13 ENCOUNTER — Encounter (HOSPITAL_COMMUNITY): Payer: Self-pay

## 2022-09-13 ENCOUNTER — Ambulatory Visit (HOSPITAL_COMMUNITY)
Admit: 2022-09-13 | Discharge: 2022-09-13 | Disposition: A | Discharge status: Home / Self Care | Payer: BC Managed Care – PPO | Attending: Family Medicine | Admitting: Family Medicine

## 2022-09-13 VITALS — BP 100/58 | HR 70 | Ht 64.0 in | Wt 126.2 lb

## 2022-09-13 DIAGNOSIS — I959 Hypotension, unspecified: Secondary | ICD-10-CM | POA: Insufficient documentation

## 2022-09-13 DIAGNOSIS — F32A Depression, unspecified: Secondary | ICD-10-CM | POA: Insufficient documentation

## 2022-09-13 DIAGNOSIS — R5383 Other fatigue: Secondary | ICD-10-CM | POA: Insufficient documentation

## 2022-09-13 DIAGNOSIS — G4700 Insomnia, unspecified: Secondary | ICD-10-CM | POA: Insufficient documentation

## 2022-09-13 DIAGNOSIS — Z79899 Other long term (current) drug therapy: Secondary | ICD-10-CM | POA: Diagnosis not present

## 2022-09-13 DIAGNOSIS — Z87898 Personal history of other specified conditions: Secondary | ICD-10-CM | POA: Diagnosis not present

## 2022-09-13 DIAGNOSIS — R000 Tachycardia, unspecified: Secondary | ICD-10-CM | POA: Diagnosis not present

## 2022-09-13 DIAGNOSIS — I5022 Chronic systolic (congestive) heart failure: Secondary | ICD-10-CM | POA: Insufficient documentation

## 2022-09-13 DIAGNOSIS — F419 Anxiety disorder, unspecified: Secondary | ICD-10-CM | POA: Insufficient documentation

## 2022-09-13 LAB — CBC
HCT: 35.1 % — ABNORMAL LOW (ref 36.0–46.0)
Hemoglobin: 12 g/dL (ref 12.0–15.0)
MCH: 30.2 pg (ref 26.0–34.0)
MCHC: 34.2 g/dL (ref 30.0–36.0)
MCV: 88.2 fL (ref 80.0–100.0)
Platelets: 296 10*3/uL (ref 150–400)
RBC: 3.98 MIL/uL (ref 3.87–5.11)
RDW: 12.7 % (ref 11.5–15.5)
WBC: 6.7 10*3/uL (ref 4.0–10.5)
nRBC: 0 % (ref 0.0–0.2)

## 2022-09-13 LAB — BASIC METABOLIC PANEL
Anion gap: 7 (ref 5–15)
BUN: 9 mg/dL (ref 6–20)
CO2: 25 mmol/L (ref 22–32)
Calcium: 9.4 mg/dL (ref 8.9–10.3)
Chloride: 105 mmol/L (ref 98–111)
Creatinine, Ser: 0.64 mg/dL (ref 0.44–1.00)
GFR, Estimated: >60 mL/min (ref >60)
Glucose, Bld: 102 mg/dL — ABNORMAL HIGH (ref 70–99)
Potassium: 4 mmol/L (ref 3.5–5.1)
Sodium: 137 mmol/L (ref 135–145)

## 2022-09-13 LAB — BRAIN NATRIURETIC PEPTIDE: B Natriuretic Peptide: 28.1 pg/mL (ref 0.0–100.0)

## 2022-09-13 MED ORDER — SPIRONOLACTONE 25 MG PO TABS: 12.5000 mg | ORAL_TABLET | Freq: Every day | ORAL | 1 refills | Status: DC

## 2022-09-13 NOTE — Patient Instructions (Addendum)
Thank you for coming in today  Labs were done today, if any labs are abnormal the clinic will call you No news is good news  START Spironolactone 12.5 mg 1/2 tablet daily  Your physician recommends that you schedule a follow-up appointment in:  3-4 weeks in clinic  3 months with Dr. Aundra Dubin with echocardiogram  Your physician has requested that you have an echocardiogram. Echocardiography is a painless test that uses sound waves to create images of your heart. It provides your doctor with information about the size and shape of your heart and how well your heart's chambers and valves are working. This procedure takes approximately one hour. There are no restrictions for this procedure.   Keep Heart rate less than 160 when exercising     Do the following things EVERYDAY: Weigh yourself in the morning before breakfast. Write it down and keep it in a log. Take your medicines as prescribed Eat low salt foods--Limit salt (sodium) to 2000 mg per day.  Stay as active as you can everyday Limit all fluids for the day to less than 2 liters  At the [facility], you and your health needs are our priority. As part of our continuing mission to provide you with exceptional heart care, we have created designated Provider Care Teams. These Care Teams include your primary Cardiologist (physician) and Advanced Practice Providers (APPs- Physician Assistants and Nurse Practitioners) who all work together to provide you with the care you need, when you need it.   You may see any of the following providers on your designated Care Team at your next follow up: Dr Glori Bickers Dr Loralie Champagne Dr. Roxana Hires, NP Lyda Jester, [place] [facility] [place], [place] Forestine Na, NP Audry Riles, PharmD   Please be sure to bring in all your medications bottles to every appointment.   If you have any questions or concerns before your next appointment please send [place] a  message through [place] or call our office at [phone].    TO LEAVE A MESSAGE FOR THE NURSE SELECT OPTION 2, PLEASE LEAVE A MESSAGE INCLUDING: YOUR NAME DATE OF BIRTH CALL BACK NUMBER REASON FOR CALL**this is important as we prioritize the call backs  YOU WILL RECEIVE A CALL BACK THE SAME DAY AS LONG AS YOU CALL BEFORE 4:00 PM

## 2022-09-13 NOTE — Progress Notes (Signed)
ADVANCED HF CLINIC CONSULT NOTE   Primary Care: Soundra Pilon, FNP HF Cardiologist: Dr. Shirlee Latch  HPI: Audrey Callahan is a 21 y.o. female with history of anxiety and depression.    She underwent septoplasty on 08/28/22. Once home, had N/V and returned to ED. Found to by hypoxic with bilateral diffuse infiltrates on CXR and CT, concerning for ARDS from aspiration PNA. Started on IV abx, eventually decompensated and required vasopressors. Echo showed EF 35-40%, RV okay, no significant valvular disease, IVC not dilated. AHF consulted. cMRI showed LVEF 46%, mild anteroseptal HK, no LGE/myocarditis. Hs-Trop elevated but no chest pain & ECG normal; felt related to demand ischemia, so ischemic work up deferred. 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 Started on GDMT after vasopressors weaned. Discharged home, weight 120 lbs.  Today she returns for post hospital HF follow up with her mother. Overall feeling fine, has some fatigue. She is not SOB with activity. Wants to get back to working out. Denies palpitations, abnormal bleeding, CP, dizziness, edema, or PND/Orthopnea. Appetite ok. No fever or chills. Weight at home stable. Taking all medications. HR now 110's on apple watch. Plans to join [employer] in future.  ECG (personally reviewed): NSR, iRBBB.    Labs (9/23): K 3.6, creatinine 0.55  Cardiac Studies:  - cMRI (9/23): LVEF 46%, RVEF 48%, no LGE, possible Takotsubo CM.  - Echo (9/23): EF 35-40%, RV okay, no significant valvular disease,   Review of Systems: [y] = yes, [ ]  = no   General: Weight gain [ ] ; Weight loss [ ] ; Anorexia [ ] ; Fatigue ]; Fever [ ] ; Chills [ ] ; Weakness [ ]   Cardiac: Chest pain/pressure [ ] ; Resting SOB [ ] ; Exertional SOB [ ] ; Orthopnea [ ] ; Pedal Edema [ ] ; Palpitations [ ] ; Syncope [ ] ; Presyncope [ ] ; Paroxysmal nocturnal dyspnea[ ]   Pulmonary: Cough [ ] ; Wheezing[ ] ; Hemoptysis[ ] ; Sputum [ ] ; Snoring [ ]   GI: Vomiting[ ] ;  Dysphagia[ ] ; Melena[ ] ; Hematochezia [ ] ; Heartburn[ ] ; Abdominal pain [ ] ; Constipation [ ] ; Diarrhea [ ] ; BRBPR [ ]   GU: Hematuria[ ] ; Dysuria [ ] ; Nocturia[ ]   Vascular: Pain in legs with walking [ ] ; Pain in feet with lying flat [ ] ; Non-healing sores [ ] ; Stroke [ ] ; TIA [ ] ; Slurred speech [ ] ;  Neuro: Headaches[ ] ; Vertigo[ ] ; Seizures[ ] ; Paresthesias[ ] ;Blurred vision [ ] ; Diplopia [ ] ; Vision changes [ ]   Ortho/Skin: Arthritis [ ] ; Joint pain [ ] ; Muscle pain [ ] ; Joint swelling [ ] ; Back Pain [ ] ; Rash [ ]   Psych: Depression[ ] ; Anxiety[ ]   Heme: Bleeding problems [ ] ; Clotting disorders [ ] ; Anemia [ ]   Endocrine: Diabetes [ ] ; Thyroid dysfunction[y ]  Past Medical History:  Diagnosis Date   Anemia    Anxiety    Asthma    Depression    Current Outpatient Medications  Medication Sig Dispense Refill   buPROPion (WELLBUTRIN XL) 150 MG 24 hr tablet Take 150 mg by mouth every morning.     Cholecalciferol (VITAMIN D) 50 MCG (2000 UT) tablet Take 2,000 Units by mouth daily.     drospirenone-ethinyl estradiol (YAZ) 3-0.02 MG tablet Take 1 tablet by mouth daily.     famotidine (PEPCID) 20 MG tablet Take 20 mg by mouth at bedtime.     ferrous sulfate 325 (65 FE) MG tablet Take 325 mg by mouth daily.     ibuprofen (ADVIL) 200 MG tablet  Take 400 mg by mouth every 6 (six) hours as needed for moderate pain.     lactase (LACTAID) 3000 units tablet Take 3,000 Units by mouth daily as needed (lactose intolerance).     Melatonin 5 MG CAPS Take 5 mg by mouth at bedtime as needed (sleep).     metoprolol succinate (TOPROL-XL) 25 MG 24 hr tablet Take 1 tablet (25 mg total) by mouth daily. 30 tablet 1   omeprazole (PRILOSEC OTC) 20 MG tablet Take 20 mg by mouth daily as needed (acid reflux).     Probiotic Product (PROBIOTIC PO) Take 1 capsule by mouth daily.     QUEtiapine (SEROQUEL) 100 MG tablet Take 100 mg by mouth at bedtime. Take with 50 mg to equal 150 mg at bedtime     QUEtiapine (SEROQUEL)  50 MG tablet Take 50 mg by mouth at bedtime. Take with 100 mg to equal 150 mg at bedtime     traZODone (DESYREL) 50 MG tablet Take 50 mg by mouth at bedtime.     No current facility-administered medications for this encounter.   Allergies  Allergen Reactions   Lactose Intolerance (Gi)     Upset stomach    Lidocaine Viscous Hcl Nausea And Vomiting   Red Dye Nausea Only and Rash   Social History   Socioeconomic History   Marital status: Single    Spouse name: Not on file   Number of children: Not on file   Years of education: Not on file   Highest education level: Not on file  Occupational History   Not on file  Tobacco Use   Smoking status: Never    Passive exposure: Never   Smokeless tobacco: Never  Vaping Use   Vaping Use: Never used  Substance and Sexual Activity   Alcohol use: Yes    Alcohol/week: 3.0 standard drinks of alcohol    Types: 3 Glasses of wine per week    Comment: pt reports 2-3 drinks twice a month   Drug use: Never   Sexual activity: Yes    Birth control/protection: Pill  Other Topics Concern   Not on file  Social History Narrative   Not on file   Social Determinants of Health   Financial Resource Strain: Not on file  Food Insecurity: No Food Insecurity (09/13/2022)   Hunger Vital Sign    Worried About Running Out of Food in the Last Year: Never true    Ran Out of Food in the Last Year: Never true  Transportation Needs: No Transportation Needs (09/13/2022)   PRAPARE - Administrator, Civil Service (Medical): No    Lack of Transportation (Non-Medical): No  Physical Activity: Not on file  Stress: Not on file  Social Connections: Not on file  Intimate Partner Violence: Not on file   Family History  Adopted: Yes   BP (!) 100/58   Pulse 70   Ht 5\' 4"  (1.626 m)   Wt 57.2 kg (126 lb 3.2 oz)   SpO2 100%   BMI 21.66 kg/m   Wt Readings from Last 3 Encounters:  09/13/22 57.2 kg (126 lb 3.2 oz)  09/01/22 54.7 kg (120 lb 8 oz)   08/28/22 56.2 kg (124 lb)   PHYSICAL EXAM: General:  NAD. No resp difficulty, walked into clinic, thin. HEENT: Normal Neck: Supple. No JVD. Carotids 2+ bilat; no bruits. No lymphadenopathy or thryomegaly appreciated. Cor: PMI nondisplaced. Regular rate & rhythm. No rubs, gallops or murmurs. Lungs: Clear Abdomen: Soft,  nontender, nondistended. No hepatosplenomegaly. No bruits or masses. Good bowel sounds. Extremities: No cyanosis, clubbing, rash, edema Neuro: Alert & oriented x 3, cranial nerves grossly intact. Moves all 4 extremities w/o difficulty. Affect pleasant.  ASSESSMENT & PLAN: 1. H/o Shock: Echo (9/23) showed EF 35-40%, normal RV, trivial MR, IVC not dilated. SBPs soft but stable in upper 90s. PCT < 0.1, septic shock seems unlikely.  - cMRI EF 46% possible Tako-tsubo vs anesthetic reaction - Resolved.  2.  Chronic systolic CHF: Echo this admission 9/23 with EF 35-40%, normal RV, trivial MR, IVC not dilated. BNP 303.  No cardiac history, though mother notes tachycardia with walking this summer.  She has not history of exercise intolerance.  Family history unknown (adopted).  No ETOH/drugs reported.  Has never been pregnant. Cardiac MRI (9/23) LVEF 46% mild anteroseptal HK No LGE/myocarditis. 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  NYHA I, she is not volume overloaded today. GDMT limited by BP. - Start spiro 12.5 mg daily. BMET today, repeat in 1 week. - Continue Toprol 25 mg qhs.  3. H/o elevated HS-TnI: HS-TnI 818>>866 in setting of hypotension during admission.  Possible demand ischemia related to hypotension or stress cardiomyopathy.  Also consider myocardial damage from myocarditis.  No chest pain and ECG is normal besides sinus tachy, doubt ACS. cMRI as above. 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 - Continue low dose Toprol 25 mg qhs. 4. Sinus tach: Chronic issue for her. Suspect potential underlying POTS or similar autonomic issues.  TSH/FT4/T3 all mildly elevated. Discussed with Endo during admission, feel may be related to stress. Also in differential is TSH-secreting tumor vs partial tissue resistance to thyroid hormone. Recommend rechecking 4-6 weeks. If still elevated consider head & neck imaging.  - Improved with Toprol 25, continue. - Consider Corlanor if HR remains elevated after beta blocker optimized. 5. Insomnia: she is on quetiapine + trazodone for sleep. QTc acceptable on ECG today at 385 msec.  RTW: given a note for her employer today asking she be allowed to stay in current office.   Follow up in 4 weeks with APP (check thyroid studies) and 12 weeks with Dr. Aundra Dubin + echo.  Allena Katz, FNP-BC 09/13/22

## 2022-09-24 ENCOUNTER — Ambulatory Visit (HOSPITAL_COMMUNITY)
Admission: RE | Admit: 2022-09-24 | Discharge: 2022-09-24 | Disposition: A | Discharge status: Home / Self Care | Payer: BC Managed Care – PPO | Source: Ambulatory Visit | Attending: Cardiology | Admitting: Cardiology

## 2022-09-24 DIAGNOSIS — I5022 Chronic systolic (congestive) heart failure: Secondary | ICD-10-CM | POA: Insufficient documentation

## 2022-09-24 LAB — BASIC METABOLIC PANEL
Anion gap: 9 (ref 5–15)
BUN: 7 mg/dL (ref 6–20)
CO2: 22 mmol/L (ref 22–32)
Calcium: 9.2 mg/dL (ref 8.9–10.3)
Chloride: 105 mmol/L (ref 98–111)
Creatinine, Ser: 0.71 mg/dL (ref 0.44–1.00)
GFR, Estimated: >60 mL/min (ref >60)
Glucose, Bld: 110 mg/dL — ABNORMAL HIGH (ref 70–99)
Potassium: 4.2 mmol/L (ref 3.5–5.1)
Sodium: 136 mmol/L (ref 135–145)

## 2022-09-30 ENCOUNTER — Telehealth (HOSPITAL_COMMUNITY): Payer: Self-pay | Admitting: *Deleted

## 2022-09-30 NOTE — Telephone Encounter (Signed)
Pt aware and agreeable with plan.  

## 2022-09-30 NOTE — Telephone Encounter (Signed)
Pt left vm stating since starting spironolactone she has been very fatigued all day and struggle to wake up and start her day. Pt does not have bp cuff to check her bp but says she has noticed some light headedness.   Routed to [employer]

## 2022-10-02 ENCOUNTER — Other Ambulatory Visit: Payer: Self-pay | Admitting: Pulmonary Disease

## 2022-10-02 ENCOUNTER — Other Ambulatory Visit (HOSPITAL_COMMUNITY): Payer: Self-pay | Admitting: Family Medicine

## 2022-10-02 MED ORDER — METOPROLOL SUCCINATE ER 25 MG PO TB24: 25.0000 mg | ORAL_TABLET | Freq: Every evening | ORAL | 6 refills | Status: DC

## 2022-10-02 NOTE — Telephone Encounter (Signed)
Good morning - I discharged this patient from the ICU. Forwarding refill request for metoprolol to you. Thanks!

## 2022-10-04 ENCOUNTER — Telehealth (HOSPITAL_COMMUNITY): Payer: Self-pay

## 2022-10-04 ENCOUNTER — Encounter (HOSPITAL_COMMUNITY): Payer: Self-pay

## 2022-10-04 ENCOUNTER — Ambulatory Visit (HOSPITAL_COMMUNITY)
Admission: RE | Admit: 2022-10-04 | Discharge: 2022-10-04 | Disposition: A | Discharge status: Home / Self Care | Payer: BC Managed Care – PPO | Source: Ambulatory Visit | Attending: Family Medicine | Admitting: Family Medicine

## 2022-10-04 ENCOUNTER — Other Ambulatory Visit (HOSPITAL_COMMUNITY): Payer: Self-pay

## 2022-10-04 VITALS — BP 102/78 | HR 70 | Wt 129.6 lb

## 2022-10-04 DIAGNOSIS — I5022 Chronic systolic (congestive) heart failure: Secondary | ICD-10-CM

## 2022-10-04 DIAGNOSIS — Z7984 Long term (current) use of oral hypoglycemic drugs: Secondary | ICD-10-CM | POA: Diagnosis not present

## 2022-10-04 DIAGNOSIS — Z79899 Other long term (current) drug therapy: Secondary | ICD-10-CM | POA: Diagnosis not present

## 2022-10-04 DIAGNOSIS — Z006 Encounter for examination for normal comparison and control in clinical research program: Secondary | ICD-10-CM

## 2022-10-04 DIAGNOSIS — G4700 Insomnia, unspecified: Secondary | ICD-10-CM | POA: Diagnosis not present

## 2022-10-04 DIAGNOSIS — R000 Tachycardia, unspecified: Secondary | ICD-10-CM

## 2022-10-04 DIAGNOSIS — Z7901 Long term (current) use of anticoagulants: Secondary | ICD-10-CM | POA: Diagnosis not present

## 2022-10-04 DIAGNOSIS — Z87898 Personal history of other specified conditions: Secondary | ICD-10-CM | POA: Diagnosis not present

## 2022-10-04 DIAGNOSIS — R5383 Other fatigue: Secondary | ICD-10-CM | POA: Insufficient documentation

## 2022-10-04 LAB — BASIC METABOLIC PANEL
Anion gap: 7 (ref 5–15)
BUN: <5 mg/dL — ABNORMAL LOW (ref 6–20)
CO2: 24 mmol/L (ref 22–32)
Calcium: 9.4 mg/dL (ref 8.9–10.3)
Chloride: 106 mmol/L (ref 98–111)
Creatinine, Ser: 0.58 mg/dL (ref 0.44–1.00)
GFR, Estimated: >60 mL/min (ref >60)
Glucose, Bld: 84 mg/dL (ref 70–99)
Potassium: 3.5 mmol/L (ref 3.5–5.1)
Sodium: 137 mmol/L (ref 135–145)

## 2022-10-04 LAB — BRAIN NATRIURETIC PEPTIDE: B Natriuretic Peptide: 7.8 pg/mL (ref 0.0–100.0)

## 2022-10-04 LAB — T4, FREE: Free T4: 0.77 ng/dL (ref 0.61–1.12)

## 2022-10-04 LAB — TSH: TSH: 1.58 u[IU]/mL (ref 0.350–4.500)

## 2022-10-04 MED ORDER — METOPROLOL SUCCINATE ER 25 MG PO TB24: 25.0000 mg | ORAL_TABLET | Freq: Every evening | ORAL | 1 refills | Status: DC

## 2022-10-04 MED ORDER — EMPAGLIFLOZIN 10 MG PO TABS: 10.0000 mg | ORAL_TABLET | Freq: Every day | ORAL | 6 refills | Status: DC

## 2022-10-04 NOTE — Research (Cosign Needed Addendum)
[facility]   Subject Number: [id number] Demographics Patient Sex:     Female $Rem'[]'LGTz$    Female $Remov'[x]'jEVCRY$    Other$Remo'[]'QKqqa$  Gender:      Man $Re'[]'VbC$   Woman $Remo'[x]'jSvDP$    Trans $Remo'[]'sruZp$    Nonbinary $RemoveBe'[]'RFoxolvOX$  Height:   5'4" in/cm Weight:   129.6 lbs. Race:    American [place] or [place] Native                  '[]'$               Asian                                                               '[]'$               Black or African American                              '[]'$               Native Hawaiian or other Pacific Islander      '[]'$               White                                                              '[x]'$               N/A or not disclosed                                       '[]'$  Ethnicity: Hispanic/Latino/Spanish Origin                   '[x]'$                   Non-Hispanic/Latino/Spanish Origin          '[]'$  Smoking Status:  Smoker                                           '[]'$                              Former Smoker                              '[]'$                              Never Smoked                               '[x]'$   Medical History: Has the patient had any of the following (check all that apply) Condition Check if applicable Start Date  Acute respiratory distress syndrome    Asthma    COPD    Emphysema    Heart Failure x   Interstitial lung disease    Lung cancer    Mesothelioma    Neuromuscular disease    Obesity hypoventilation syndrome    Pleural effusion    Pneumonia    Pneumothorax    Pulmonary edema    Pulmonary embolism    Suspected or confirmed COVID    OTHER    None of the above      [facility] Informed Consent   Subject Name: Audrey Callahan  Subject met inclusion and exclusion criteria.  The informed consent form, study requirements and expectations were reviewed with the subject and questions and concerns were addressed prior to the signing of the consent form.  The subject verbalized understanding of the trial requirements.  The subject agreed to participate in the [facility] trial and signed the  informed consent at 1605 on 10/04/2022.  The informed consent was obtained prior to performance of any protocol-specific procedures for the subject.  A copy of the signed informed consent was given to the subject and a copy was placed in the subject's medical record.   Audrey Callahan   Protocol version 1 Consent version 1

## 2022-10-04 NOTE — Progress Notes (Signed)
ADVANCED HF CLINIC CONSULT NOTE   Primary Care: Soundra Pilon, FNP HF Cardiologist: Dr. Shirlee Latch  HPI: Audrey Callahan is a 21 y.o. female with history of anxiety and depression.    She underwent septoplasty on 08/28/22. Once home, had N/V and returned to ED. Found to by hypoxic with bilateral diffuse infiltrates on CXR and CT, concerning for ARDS from aspiration PNA. Started on IV abx, eventually decompensated and required vasopressors. Echo showed EF 35-40%, RV okay, no significant valvular disease, IVC not dilated. AHF consulted. cMRI showed LVEF 46%, mild anteroseptal HK, no LGE/myocarditis. Hs-Trop elevated but no chest pain & ECG normal; felt related to demand ischemia, so ischemic work up deferred. 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 Started on GDMT after vasopressors weaned. Discharged home, weight 120 lbs.  Follow up 9/23, low dose spiro added. This was later stopped 2/2 increased fatigue and dizziness.  Today she returns for follow up. Overall feeling fine, main complaint is extreme fatigue. Symptoms did not improve when she stopped spiro. She attributes her fatigue in part due to her trazodone. She is not SOB with activity. Was out of Toprol x 1 day and noticed palpitations. Denies abnormal bleeding, CP, edema, or PND/Orthopnea. Appetite ok, no fever or chills. Weight at home stable. Taking all medications. HR now 70-80's on Apple watch. Plans to join [employer] in future.  ECG (personally reviewed): none ordered today.  Labs (9/23): K 3.6, creatinine 0.55 Labs (10/23): K 4.2, creatinine 0.71  Cardiac Studies:  - cMRI (9/23): LVEF 46%, RVEF 48%, no LGE, possible Takotsubo CM.  - Echo (9/23): EF 35-40%, RV okay, no significant valvular disease,   Past Medical History:  Diagnosis Date   Anemia    Anxiety    Asthma    Depression    Current Outpatient Medications  Medication Sig Dispense Refill   buPROPion (WELLBUTRIN XL) 150 MG 24 hr tablet  Take 150 mg by mouth every morning.     Cholecalciferol (VITAMIN D) 50 MCG (2000 UT) tablet Take 2,000 Units by mouth daily.     drospirenone-ethinyl estradiol (YAZ) 3-0.02 MG tablet Take 1 tablet by mouth daily.     empagliflozin (JARDIANCE) 10 MG TABS tablet Take 1 tablet (10 mg total) by mouth daily before breakfast. 30 tablet 6   famotidine (PEPCID) 20 MG tablet Take 20 mg by mouth at bedtime.     ferrous sulfate 325 (65 FE) MG tablet Take 325 mg by mouth daily.     ibuprofen (ADVIL) 200 MG tablet Take 400 mg by mouth every 6 (six) hours as needed for moderate pain.     lactase (LACTAID) 3000 units tablet Take 3,000 Units by mouth daily as needed (lactose intolerance).     Melatonin 5 MG CAPS Take 5 mg by mouth at bedtime as needed (sleep).     omeprazole (PRILOSEC OTC) 20 MG tablet Take 20 mg by mouth daily as needed (acid reflux).     Probiotic Product (PROBIOTIC PO) Take 1 capsule by mouth daily.     QUEtiapine (SEROQUEL) 100 MG tablet Take 100 mg by mouth at bedtime. Take with 50 mg to equal 150 mg at bedtime     QUEtiapine (SEROQUEL) 50 MG tablet Take 50 mg by mouth at bedtime. Take with 100 mg to equal 150 mg at bedtime     traZODone (DESYREL) 50 MG tablet Take 50 mg by mouth at bedtime.     metoprolol succinate (TOPROL-XL) 25 MG 24 hr  tablet Take 1 tablet (25 mg total) by mouth at bedtime. 90 tablet 1   No current facility-administered medications for this encounter.   Allergies  Allergen Reactions   Lactose Intolerance (Gi)     Upset stomach    Lidocaine Viscous Hcl Nausea And Vomiting   Red Dye Nausea Only and Rash   Social History   Socioeconomic History   Marital status: Single    Spouse name: Not on file   Number of children: Not on file   Years of education: Not on file   Highest education level: Not on file  Occupational History   Not on file  Tobacco Use   Smoking status: Never    Passive exposure: Never   Smokeless tobacco: Never  Vaping Use   Vaping Use:  Never used  Substance and Sexual Activity   Alcohol use: Yes    Alcohol/week: 3.0 standard drinks of alcohol    Types: 3 Glasses of wine per week    Comment: pt reports 2-3 drinks twice a month   Drug use: Never   Sexual activity: Yes    Birth control/protection: Pill  Other Topics Concern   Not on file  Social History Narrative   Not on file   Social Determinants of Health   Financial Resource Strain: Not on file  Food Insecurity: No Food Insecurity (09/13/2022)   Hunger Vital Sign    Worried About Running Out of Food in the Last Year: Never true    Ran Out of Food in the Last Year: Never true  Transportation Needs: No Transportation Needs (09/13/2022)   PRAPARE - Administrator, Civil Service (Medical): No    Lack of Transportation (Non-Medical): No  Physical Activity: Not on file  Stress: Not on file  Social Connections: Not on file  Intimate Partner Violence: Not on file   Family History  Adopted: Yes   BP 102/78   Pulse 70   Wt 58.8 kg   SpO2 99%   BMI 22.25 kg/m   Wt Readings from Last 3 Encounters:  10/04/22 58.8 kg  09/13/22 57.2 kg  09/01/22 54.7 kg   PHYSICAL EXAM: General:  NAD. No resp difficulty HEENT: Normal Neck: Supple. No JVD. Carotids 2+ bilat; no bruits. No lymphadenopathy or thryomegaly appreciated. Cor: PMI nondisplaced. Regular rate & rhythm. No rubs, gallops or murmurs. Lungs: Clear Abdomen: Soft, nontender, nondistended. No hepatosplenomegaly. No bruits or masses. Good bowel sounds. Extremities: No cyanosis, clubbing, rash, edema Neuro: Alert & oriented x 3, cranial nerves grossly intact. Moves all 4 extremities w/o difficulty. Affect pleasant.  ASSESSMENT & PLAN: 1.  Chronic systolic CHF: Echo this admission 9/23 with EF 35-40%, normal RV, trivial MR, IVC not dilated. BNP 303.  No cardiac history, though mother notes tachycardia with walking this summer.  She has not history of exercise intolerance.  Family history unknown  (adopted).  No ETOH/drugs reported.  Has never been pregnant. Cardiac MRI (9/23) LVEF 46% mild anteroseptal HK No LGE/myocarditis. 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  NYHA I, she is not volume overloaded today. GDMT limited by BP. - Start Jardiance 10 mg daily. No issues with GU infections. Pt given co-pay card. Notify for increased fatigue or dizziness. BMET today. - Continue Toprol 25 mg qhs. Refilled today. - Off spiro due to fatigue and dizziness. (Although her symptoms failed to improve when she stopped; consider re-challenge in the future) - No BP room to add ARB/ARNi  2. H/o Shock: Echo (9/23) showed EF 35-40%, normal RV, trivial MR, IVC not dilated. SBPs soft but stable in upper 90s. PCT < 0.1, septic shock unlikely.  - cMRI EF 46% possible Tako-tsubo vs anesthetic reaction - Resolved.  3. H/o elevated HS-TnI: HS-TnI 818>>866 in setting of hypotension during admission.  Possible demand ischemia related to hypotension or stress cardiomyopathy.  Also consider myocardial damage from myocarditis.  No chest pain. and ECG normal, doubt ACS. cMRI as above. 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 - Continue low dose Toprol 25 mg qhs. 4. Sinus tach: Chronic issue for her. Suspect potential underlying POTS or similar autonomic issues. TSH/FT4/T3 all mildly elevated. Discussed with Endo during admission, feel may be related to stress. Also in differential is TSH-secreting tumor vs partial tissue resistance to thyroid hormone. Repeat thyroid studies. If still elevated consider head & neck imaging. She has endocrinology appt next week. - Continue beta blocker. HR 70 bpm today. 5. Insomnia: she is on quetiapine + trazodone for sleep. Last QTc on ECG acceptable. 6. Fatigue: Likely multifactorial.  - Advised taking trazodone earlier in evening (around 8 pm) to see if this helps with morning fatigue. - Beta blocker could certainly cause fatigue, would like to keep on  board if we can. Can consider decreasing to 12.5 q hs. - Checking thyroid studies today.  Follow up with Dr. Aundra Dubin + echo as scheduled.  Allena Katz, FNP-BC 10/04/22

## 2022-10-04 NOTE — Patient Instructions (Addendum)
Thank you for coming in today  Labs were done today, if any labs are abnormal the clinic will call you No news is good news  START Jardiance 10 mg 1 tablet daily     Do the following things EVERYDAY: Weigh yourself in the morning before breakfast. Write it down and keep it in a log. Take your medicines as prescribed Eat low salt foods--Limit salt (sodium) to 2000 mg per day.  Stay as active as you can everyday Limit all fluids for the day to less than 2 liters  If you have any questions or concerns before your next appointment please send [place] a message through [place] or call our office at [phone].    TO LEAVE A MESSAGE FOR THE NURSE SELECT OPTION 2, PLEASE LEAVE A MESSAGE INCLUDING: YOUR NAME DATE OF BIRTH CALL BACK NUMBER REASON FOR CALL**this is important as we prioritize the call backs  YOU WILL RECEIVE A CALL BACK THE SAME DAY AS LONG AS YOU CALL BEFORE 4:00 PM At the [facility], you and your health needs are our priority. As part of our continuing mission to provide you with exceptional heart care, we have created designated Provider Care Teams. These Care Teams include your primary Cardiologist (physician) and Advanced Practice Providers (APPs- Physician Assistants and Nurse Practitioners) who all work together to provide you with the care you need, when you need it.   You may see any of the following providers on your designated Care Team at your next follow up: Dr Glori Bickers Dr Loralie Champagne Dr. Roxana Hires, NP Lyda Jester, [place] [facility] [place], [place] Forestine Na, NP Audry Riles, PharmD   Please be sure to bring in all your medications bottles to every appointment.

## 2022-10-04 NOTE — Telephone Encounter (Signed)
Advanced Heart Failure Patient Advocate Encounter   Patient is in office and prior authorization is required for Jardiance 10 MG. PA submitted and APPROVED on 10/04/2022.  Key [id number] Effective: 09/04/2022 - 10/04/2023  Clista Bernhardt, CPhT Rx Patient Advocate Phone: [phone]

## 2022-10-05 LAB — T3, FREE: T3, Free: 3.6 pg/mL (ref 2.0–4.4)

## 2022-10-08 NOTE — Patient Instructions (Signed)
Thyroid-Stimulating Hormone Test Why am I having this test? The thyroid is a gland in the lower front of the neck. It makes hormones that affect many body parts and systems, including the system that affects how quickly the body burns fuel for energy (metabolism). The pituitary gland is located just below the brain, behind the eyes and nasal passages. It helps maintain thyroid hormone levels and thyroid gland function. You may have a thyroid-stimulating hormone (TSH) test if you have possible symptoms of abnormal thyroid hormone levels. This test can help your health care provider: Diagnose a disorder of the thyroid gland or pituitary gland. Manage your condition and treatment if you have an underactive thyroid (hypothyroidism) or an overactive thyroid (hyperthyroidism). Newborn babies may have this test done to screen for hypothyroidism that is present at birth (congenital). What is being tested? This test measures the amount of TSH in your blood. TSH may also be called thyrotropin. When the thyroid does not make enough hormones, the pituitary gland releases TSH into the bloodstream to stimulate the thyroid gland to make more hormones. What kind of sample is taken?     A blood sample is required for this test. It is usually collected by inserting a needle into a blood vessel. For newborns, a small amount of blood may be collected from the umbilical cord, or by using a small needle to prick the baby's heel (heel stick). Tell a health care provider about: All medicines you are taking, including vitamins, herbs, eye drops, creams, and over-the-counter medicines. Any bleeding problems you have. Any surgeries you have had. Any medical conditions you have. Whether you are pregnant or may be pregnant. How are the results reported? Your test results will be reported as a value that indicates how much TSH is in your blood. Your health care provider will compare your results to normal ranges that were  established after testing a large group of people (reference ranges). Reference ranges may vary among labs and hospitals. For this test, common reference ranges are: Adult: 2-10 microunits/mL or 2-10 milliunits/L. Newborn: Heel stick: 3-18 microunits/mL or 3-18 milliunits/L. Umbilical cord: 3-12 microunits/mL or 3-12 milliunits/L. What do the results mean? Results that are within the reference range are considered normal. This means that you have a normal amount of TSH in your blood. Results that are higher than the reference range mean that your TSH levels are too high. This may mean: Your thyroid gland is not making enough thyroid hormones. Your thyroid medicine dosage is too low. You have a tumor on your pituitary gland. This is rare. Results that are lower than the reference range mean that your TSH levels are too low. This may be caused by hyperthyroidism or by a problem with the pituitary gland function. Talk with your health care provider about what your results mean. Questions to ask your health care provider Ask your health care provider, or the department that is doing the test: When will my results be ready? How will I get my results? What are my treatment options? What other tests do I need? What are my next steps? Summary You may have a thyroid-stimulating hormone (TSH) test if you have possible symptoms of abnormal thyroid hormone levels. The thyroid is a gland in the lower front of the neck. It makes hormones that affect many body parts and systems. The pituitary gland is located just below the brain, behind the eyes and nasal passages. It helps maintain thyroid hormone levels and thyroid gland function. This test   measures the amount of TSH in your blood. TSH is made by the pituitary gland. It may also be called thyrotropin. This information is not intended to replace advice given to you by your health care provider. Make sure you discuss any questions you have with your  health care provider. Document Revised: 12/04/2021 Document Reviewed: 12/04/2021 Elsevier Patient Education  2023 Elsevier Inc.  

## 2022-10-09 ENCOUNTER — Ambulatory Visit (INDEPENDENT_AMBULATORY_CARE_PROVIDER_SITE_OTHER): Payer: BC Managed Care – PPO | Admitting: Nurse Practitioner

## 2022-10-09 ENCOUNTER — Encounter: Payer: Self-pay | Admitting: Nurse Practitioner

## 2022-10-09 VITALS — BP 109/75 | HR 71 | Ht 64.0 in | Wt 131.0 lb

## 2022-10-09 DIAGNOSIS — R7989 Other specified abnormal findings of blood chemistry: Secondary | ICD-10-CM | POA: Diagnosis not present

## 2022-10-09 NOTE — Progress Notes (Signed)
Endocrinology Consult Note                                         10/09/2022, 2:09 PM  Subjective:   Subjective    Audrey Callahan is a 21 y.o.-year-old female patient being seen in consultation for abnormal thyroid labs referred by Kristen Loader, FNP.   Past Medical History:  Diagnosis Date   Anemia    Anxiety    Asthma    Depression     Past Surgical History:  Procedure Laterality Date   MULTIPLE TOOTH EXTRACTIONS  2013   NASAL HEMORRHAGE CONTROL Bilateral 08/28/2022   Procedure: EPISTAXIS CONTROL;  Surgeon: Jason Coop, DO;  Location: [facility];  Service: ENT;  Laterality: Bilateral;   NASAL SEPTOPLASTY W/ TURBINOPLASTY Bilateral 08/28/2022   Procedure: NASAL SEPTOPLASTY WITH TURBINATE REDUCTION AND REPAIR OF NASAL VALVE COLLAPSE;  Surgeon: Jason Coop, DO;  Location: [facility];  Service: ENT;  Laterality: Bilateral;   NASAL SEPTUM SURGERY  08/28/2022    Social History   Socioeconomic History   Marital status: Single    Spouse name: Not on file   Number of children: Not on file   Years of education: Not on file   Highest education level: Not on file  Occupational History   Not on file  Tobacco Use   Smoking status: Never    Passive exposure: Never   Smokeless tobacco: Never  Vaping Use   Vaping Use: Never used  Substance and Sexual Activity   Alcohol use: Yes    Alcohol/week: 3.0 standard drinks of alcohol    Types: 3 Glasses of wine per week    Comment: pt reports 2-3 drinks twice a month   Drug use: Never   Sexual activity: Yes    Birth control/protection: Pill  Other Topics Concern   Not on file  Social History Narrative   Not on file   Social Determinants of Health   Financial Resource Strain: Not on file  Food Insecurity: No Food Insecurity (09/13/2022)   Hunger Vital Sign    Worried About Running Out of Food in the Last Year: Never true    Ran Out of Food in the Last  Year: Never true  Transportation Needs: No Transportation Needs (09/13/2022)   PRAPARE - Hydrologist (Medical): No    Lack of Transportation (Non-Medical): No  Physical Activity: Not on file  Stress: Not on file  Social Connections: Not on file    Family History  Adopted: Yes    Outpatient Encounter Medications as of 10/09/2022  Medication Sig   buPROPion (WELLBUTRIN XL) 150 MG 24 hr tablet Take 150 mg by mouth every morning.   Cholecalciferol (VITAMIN D) 50 MCG (2000 UT) tablet Take 2,000 Units by mouth daily.   drospirenone-ethinyl estradiol (YAZ) 3-0.02 MG tablet Take 1 tablet by mouth daily.   empagliflozin (JARDIANCE) 10 MG TABS tablet Take 1 tablet (10 mg total) by mouth daily before breakfast.  famotidine (PEPCID) 20 MG tablet Take 20 mg by mouth at bedtime.   ferrous sulfate 325 (65 FE) MG tablet Take 325 mg by mouth daily.   ibuprofen (ADVIL) 200 MG tablet Take 400 mg by mouth every 6 (six) hours as needed for moderate pain.   lactase (LACTAID) 3000 units tablet Take 3,000 Units by mouth daily as needed (lactose intolerance).   Melatonin 5 MG CAPS Take 5 mg by mouth at bedtime as needed (sleep).   metoprolol succinate (TOPROL-XL) 25 MG 24 hr tablet Take 1 tablet (25 mg total) by mouth at bedtime.   omeprazole (PRILOSEC OTC) 20 MG tablet Take 20 mg by mouth daily as needed (acid reflux).   Probiotic Product (PROBIOTIC PO) Take 1 capsule by mouth daily.   QUEtiapine (SEROQUEL) 100 MG tablet Take 100 mg by mouth at bedtime. Take with 50 mg to equal 150 mg at bedtime   QUEtiapine (SEROQUEL) 50 MG tablet Take 50 mg by mouth at bedtime. Take with 100 mg to equal 150 mg at bedtime   traZODone (DESYREL) 50 MG tablet Take 50 mg by mouth at bedtime.   No facility-administered encounter medications on file as of 10/09/2022.    ALLERGIES: Allergies  Allergen Reactions   Lactose Intolerance (Gi)     Upset stomach    Lidocaine Viscous Hcl Nausea And  Vomiting   Red Dye Nausea Only and Rash   VACCINATION STATUS:  There is no immunization history on file for this patient.   HPI   Audrey Callahan is a patient with the above medical history. She was recently hospitalized after a reaction to anesthesia from recent septoplasty requiring time spent in ICU and more labs were done during that time to determine etiology.  Her TSH was elevated slightly at 5.775 on 08/29/22 with normal Free T4 of 1.29.  She did have repeat labs just 5 days ago on 10/04/22 showing euthyroid presentation.  She is not on any thyroid hormones or antithyroid medications.  She has never taken Amiodarone or Lithium.   I reviewed patient's thyroid tests:  Lab Results  Component Value Date   TSH 1.580 10/04/2022   TSH 5.775 (H) 08/29/2022   FREET4 0.77 10/04/2022   FREET4 1.29 (H) 08/29/2022     Pt describes: - weight gain - fatigue - cold intolerance - depression - constipation - dry skin - hair loss  Pt denies feeling nodules in neck, hoarseness, dysphagia/odynophagia, SOB with lying down.  she denies any known family history of thyroid disorders (she was adopted and does not know her family history).  No known family history of thyroid cancer.  No history of radiation therapy to head or neck.  No recent use of iodine supplements.  Denies use of Biotin containing supplements.  I reviewed her chart and she also has a history of acute systolic heart failure and IBS.     ROS:  Constitutional: no weight gain/loss,+ fatigue, no subjective hyperthermia, + subjective hypothermia Eyes: no blurry vision, no xerophthalmia ENT: no sore throat, no nodules palpated in throat, + dysphagia with eating, no odynophagia, no hoarseness Cardiovascular: no chest pain, no SOB, no palpitations, no leg swelling Respiratory: no cough, no SOB Gastrointestinal: no nausea/vomiting/diarrhea Musculoskeletal: no muscle/joint aches Skin: no rashes, + dry skin Neurological: no  tremors, no numbness, no tingling, no dizziness Psychiatric: no depression, no anxiety   Objective:   Objective     BP 109/75   Pulse 71   Ht 5\' 4"  (1.626 m)  Wt 131 lb (59.4 kg)   BMI 22.49 kg/m  Wt Readings from Last 3 Encounters:  10/09/22 131 lb (59.4 kg)  10/04/22 129 lb 9.6 oz (58.8 kg)  09/13/22 126 lb 3.2 oz (57.2 kg)    BP Readings from Last 3 Encounters:  10/09/22 109/75  10/04/22 102/78  09/13/22 (!) 100/58     Constitutional:  Body mass index is 22.49 kg/m., not in acute distress, normal state of mind Eyes: PERRLA, EOMI, no exophthalmos ENT: moist mucous membranes, mild thyromegaly, no palpable nodularity, no cervical lymphadenopathy Cardiovascular: normal precordial activity, RRR, no murmur/rubs/gallops Respiratory:  adequate breathing efforts, no gross chest deformity, Clear to auscultation bilaterally Gastrointestinal: abdomen soft, non-tender, no distension, bowel sounds present Musculoskeletal: no gross deformities, strength intact in all four extremities Skin: moist, warm, no rashes Neurological: slight tremor with outstretched hands, deep tendon reflexes normal in BLE.   CMP ( most recent) CMP     Component Value Date/Time   NA 137 10/04/2022 1601   K 3.5 10/04/2022 1601   CL 106 10/04/2022 1601   CO2 24 10/04/2022 1601   GLUCOSE 84 10/04/2022 1601   BUN <5 (L) 10/04/2022 1601   CREATININE 0.58 10/04/2022 1601   CALCIUM 9.4 10/04/2022 1601   PROT 7.3 09/30/2021 1820   ALBUMIN 4.6 09/30/2021 1820   AST 16 09/30/2021 1820   ALT 19 09/30/2021 1820   ALKPHOS 52 09/30/2021 1820   BILITOT 0.5 09/30/2021 1820   GFRNONAA >60 10/04/2022 1601     Diabetic Labs (most recent): No results found for: "HGBA1C", "MICROALBUR"   Lipid Panel ( most recent) Lipid Panel  No results found for: "CHOL", "TRIG", "HDL", "CHOLHDL", "VLDL", "LDLCALC", "LDLDIRECT", "LABVLDL"     Lab Results  Component Value Date   TSH 1.580 10/04/2022   TSH 5.775 (H)  08/29/2022   FREET4 0.77 10/04/2022   FREET4 1.29 (H) 08/29/2022      Assessment & Plan:   ASSESSMENT / PLAN:  1. Abnormal TSH  She did have abnormal TSH during recent hospitalization which upon repeating labs shows normal function.  More information is needed to identify any potential thyroid condition.    - Will check thyroid tests before next visit: TSH, free T4, free T3, and antibody testing since she does not know her biological parents medical history.  -Given her mild thyroid enlargement on exam and symptom of difficulty swallowing when eating, will order thyroid ultrasound to get baseline on thyroid anatomy.    - Time spent with the patient: 45 minutes, of which >50% was spent in obtaining information about her symptoms, reviewing her previous labs, evaluations, and treatments, counseling her about her hypothyroidism, and developing a plan to confirm the diagnosis and long term treatment as necessary. Please refer to "Patient Self Inventory" in the Media tab for reviewed elements of pertinent patient history.  Ranee Gosselin participated in the discussions, expressed understanding, and voiced agreement with the above plans.  All questions were answered to her satisfaction. she is encouraged to contact clinic should she have any questions or concerns prior to her return visit.   FOLLOW UP PLAN:  Return in about 4 weeks (around 11/06/2022) for Thyroid follow up, thyroid ultrasound, Previsit labs.  Rayetta Pigg, [facility] [address] [place], [place] [address] Phone: [phone] Fax: [phone]  10/09/2022, 2:09 PM

## 2022-10-10 LAB — THYROID PEROXIDASE ANTIBODY: Thyroperoxidase Ab SerPl-aCnc: 17 IU/mL (ref 0–34)

## 2022-10-10 LAB — THYROGLOBULIN ANTIBODY: Thyroglobulin Antibody: <1 IU/mL (ref 0.0–0.9)

## 2022-10-10 LAB — T4, FREE: Free T4: 0.98 ng/dL (ref 0.82–1.77)

## 2022-10-10 LAB — TSH: TSH: 2.17 u[IU]/mL (ref 0.450–4.500)

## 2022-10-10 LAB — T3, FREE: T3, Free: 3.5 pg/mL (ref 2.0–4.4)

## 2022-10-30 ENCOUNTER — Telehealth: Payer: Self-pay | Admitting: Nurse Practitioner

## 2022-10-30 NOTE — Telephone Encounter (Signed)
I called [facility] and they advised that the order had been sent to [facility] and this is why patient had not heard from them.  I called [facility], the order was there but they were not sure as to why patient had not heard from them. The person that I spoke to said that she was going to call the patient to make the appointment for the US - Thyroid.

## 2022-10-30 NOTE — Telephone Encounter (Signed)
Pt is asking why she hasn't been called to set up her ultrasound yet. She has a f/u appt on 11/30 with [facility]

## 2022-11-04 ENCOUNTER — Ambulatory Visit (HOSPITAL_COMMUNITY)
Admission: RE | Admit: 2022-11-04 | Discharge: 2022-11-04 | Disposition: A | Discharge status: Home / Self Care | Payer: BC Managed Care – PPO | Source: Ambulatory Visit | Attending: Nurse Practitioner | Admitting: Nurse Practitioner

## 2022-11-04 DIAGNOSIS — R7989 Other specified abnormal findings of blood chemistry: Secondary | ICD-10-CM | POA: Insufficient documentation

## 2022-11-14 ENCOUNTER — Encounter: Payer: Self-pay | Admitting: Nurse Practitioner

## 2022-11-14 ENCOUNTER — Ambulatory Visit (INDEPENDENT_AMBULATORY_CARE_PROVIDER_SITE_OTHER): Payer: BC Managed Care – PPO | Admitting: Nurse Practitioner

## 2022-11-14 VITALS — BP 99/64 | HR 71 | Ht 64.0 in | Wt 128.8 lb

## 2022-11-14 DIAGNOSIS — R7989 Other specified abnormal findings of blood chemistry: Secondary | ICD-10-CM

## 2022-11-14 NOTE — Progress Notes (Signed)
Endocrinology Follow Up Note                                         11/14/2022, 10:34 AM  Subjective:   Subjective    Audrey Callahan is a 21 y.o.-year-old female patient being seen in follow up after being seen in consultation for abnormal thyroid labs referred by Audrey Pilon, FNP.   Past Medical History:  Diagnosis Date   Anemia    Anxiety    Asthma    Depression     Past Surgical History:  Procedure Laterality Date   MULTIPLE TOOTH EXTRACTIONS  2013   NASAL HEMORRHAGE CONTROL Bilateral 08/28/2022   Procedure: EPISTAXIS CONTROL;  Surgeon: Audrey Boom, DO;  Location: [facility];  Service: ENT;  Laterality: Bilateral;   NASAL SEPTOPLASTY W/ TURBINOPLASTY Bilateral 08/28/2022   Procedure: NASAL SEPTOPLASTY WITH TURBINATE REDUCTION AND REPAIR OF NASAL VALVE COLLAPSE;  Surgeon: Audrey Boom, DO;  Location: [facility];  Service: ENT;  Laterality: Bilateral;   NASAL SEPTUM SURGERY  08/28/2022    Social History   Socioeconomic History   Marital status: Single    Spouse name: Not on file   Number of children: Not on file   Years of education: Not on file   Highest education level: Not on file  Occupational History   Not on file  Tobacco Use   Smoking status: Never    Passive exposure: Never   Smokeless tobacco: Never  Vaping Use   Vaping Use: Never used  Substance and Sexual Activity   Alcohol use: Yes    Alcohol/week: 3.0 standard drinks of alcohol    Types: 3 Glasses of wine per week    Comment: pt reports 2-3 drinks twice a month   Drug use: Never   Sexual activity: Yes    Birth control/protection: Pill  Other Topics Concern   Not on file  Social History Narrative   Not on file   Social Determinants of Health   Financial Resource Strain: Not on file  Food Insecurity: No Food Insecurity (09/13/2022)   Hunger Vital Sign    Worried About Running Out of Food in the Last Year: Never true     Ran Out of Food in the Last Year: Never true  Transportation Needs: No Transportation Needs (09/13/2022)   PRAPARE - Administrator, Civil Service (Medical): No    Lack of Transportation (Non-Medical): No  Physical Activity: Not on file  Stress: Not on file  Social Connections: Not on file    Family History  Adopted: Yes    Outpatient Encounter Medications as of 11/14/2022  Medication Sig   buPROPion (WELLBUTRIN XL) 150 MG 24 hr tablet Take 150 mg by mouth every morning.   Cholecalciferol (VITAMIN D) 50 MCG (2000 UT) tablet Take 2,000 Units by mouth daily.   drospirenone-ethinyl estradiol (YAZ) 3-0.02 MG tablet Take 1 tablet by mouth daily.   empagliflozin (JARDIANCE) 10 MG TABS tablet Take 1 tablet (10 mg  total) by mouth daily before breakfast.   famotidine (PEPCID) 20 MG tablet Take 20 mg by mouth at bedtime.   ferrous sulfate 325 (65 FE) MG tablet Take 325 mg by mouth daily.   ibuprofen (ADVIL) 200 MG tablet Take 400 mg by mouth every 6 (six) hours as needed for moderate pain.   lactase (LACTAID) 3000 units tablet Take 3,000 Units by mouth daily as needed (lactose intolerance).   Melatonin 5 MG CAPS Take 5 mg by mouth at bedtime as needed (sleep).   metoprolol succinate (TOPROL-XL) 25 MG 24 hr tablet Take 1 tablet (25 mg total) by mouth at bedtime.   omeprazole (PRILOSEC OTC) 20 MG tablet Take 20 mg by mouth daily as needed (acid reflux).   Probiotic Product (PROBIOTIC PO) Take 1 capsule by mouth daily.   QUEtiapine (SEROQUEL) 100 MG tablet Take 100 mg by mouth at bedtime. Take with 50 mg to equal 150 mg at bedtime   QUEtiapine (SEROQUEL) 50 MG tablet Take 50 mg by mouth at bedtime. Take with 100 mg to equal 150 mg at bedtime   traZODone (DESYREL) 50 MG tablet Take 50 mg by mouth at bedtime.   No facility-administered encounter medications on file as of 11/14/2022.    ALLERGIES: Allergies  Allergen Reactions   Lactose Intolerance (Gi)     Upset stomach     Lidocaine Viscous Hcl Nausea And Vomiting   Red Dye Nausea Only and Rash   VACCINATION STATUS:  There is no immunization history on file for this patient.   HPI   Audrey Callahan is a patient with the above medical history. She was recently hospitalized after a reaction to anesthesia from recent septoplasty requiring time spent in ICU and more labs were done during that time to determine etiology.  Her TSH was elevated slightly at 5.775 on 08/29/22 with normal Free T4 of 1.29.  She did have repeat labs just 5 days ago on 10/04/22 showing euthyroid presentation.  She is not on any thyroid hormones or antithyroid medications.  She has never taken Amiodarone or Lithium.   I reviewed patient's thyroid tests:  Lab Results  Component Value Date   TSH 2.170 10/09/2022   TSH 1.580 10/04/2022   TSH 5.775 (H) 08/29/2022   FREET4 0.98 10/09/2022   FREET4 0.77 10/04/2022   FREET4 1.29 (H) 08/29/2022     Pt describes: - weight gain - fatigue - cold intolerance - depression - constipation - dry skin - hair loss  Pt denies feeling nodules in neck, hoarseness, dysphagia/odynophagia, SOB with lying down.  she denies any known family history of thyroid disorders (she was adopted and does not know her family history).  No known family history of thyroid cancer.  No history of radiation therapy to head or neck.  No recent use of iodine supplements.  Denies use of Biotin containing supplements.  I reviewed her chart and she also has a history of acute systolic heart failure and IBS.     ROS:  Constitutional: no weight gain/loss,+ fatigue, no subjective hyperthermia, + subjective hypothermia Eyes: no blurry vision, no xerophthalmia ENT: no sore throat, no nodules palpated in throat, + dysphagia with eating, no odynophagia, no hoarseness Cardiovascular: no chest pain, no SOB, no palpitations, no leg swelling Respiratory: no cough, no SOB Gastrointestinal: no  nausea/vomiting/diarrhea Musculoskeletal: no muscle/joint aches Skin: no rashes, + dry skin Neurological: no tremors, no numbness, no tingling, no dizziness Psychiatric: no depression, no anxiety   Objective:   Objective  BP 99/64 (BP Location: Left Arm, Patient Position: Sitting, Cuff Size: Normal)   Pulse 71   Ht 5\' 4"  (1.626 m)   Wt 128 lb 12.8 oz (58.4 kg)   BMI 22.11 kg/m  Wt Readings from Last 3 Encounters:  11/14/22 128 lb 12.8 oz (58.4 kg)  10/09/22 131 lb (59.4 kg)  10/04/22 129 lb 9.6 oz (58.8 kg)    BP Readings from Last 3 Encounters:  11/14/22 99/64  10/09/22 109/75  10/04/22 102/78     Constitutional:  Body mass index is 22.11 kg/m., not in acute distress, normal state of mind Eyes: PERRLA, EOMI, no exophthalmos ENT: moist mucous membranes, mild thyromegaly, no palpable nodularity, no cervical lymphadenopathy Cardiovascular: normal precordial activity, RRR, no murmur/rubs/gallops Respiratory:  adequate breathing efforts, no gross chest deformity, Clear to auscultation bilaterally Gastrointestinal: abdomen soft, non-tender, no distension, bowel sounds present Musculoskeletal: no gross deformities, strength intact in all four extremities Skin: moist, warm, no rashes Neurological: slight tremor with outstretched hands, deep tendon reflexes normal in BLE.   CMP ( most recent) CMP     Component Value Date/Time   NA 137 10/04/2022 1601   K 3.5 10/04/2022 1601   CL 106 10/04/2022 1601   CO2 24 10/04/2022 1601   GLUCOSE 84 10/04/2022 1601   BUN <5 (L) 10/04/2022 1601   CREATININE 0.58 10/04/2022 1601   CALCIUM 9.4 10/04/2022 1601   PROT 7.3 09/30/2021 1820   ALBUMIN 4.6 09/30/2021 1820   AST 16 09/30/2021 1820   ALT 19 09/30/2021 1820   ALKPHOS 52 09/30/2021 1820   BILITOT 0.5 09/30/2021 1820   GFRNONAA >60 10/04/2022 1601     Diabetic Labs (most recent): No results found for: "HGBA1C", "MICROALBUR"   Lipid Panel ( most recent) Lipid Panel   No results found for: "CHOL", "TRIG", "HDL", "CHOLHDL", "VLDL", "LDLCALC", "LDLDIRECT", "LABVLDL"     Lab Results  Component Value Date   TSH 2.170 10/09/2022   TSH 1.580 10/04/2022   TSH 5.775 (H) 08/29/2022   FREET4 0.98 10/09/2022   FREET4 0.77 10/04/2022   FREET4 1.29 (H) 08/29/2022      Assessment & Plan:   ASSESSMENT / PLAN:  1. Abnormal TSH-resolved  She did have abnormal TSH during recent hospitalization which upon repeating labs shows normal function.     -Her repeat thyroid function tests were WNL, antibodies were negative ruling out autoimmune thyroid disease.    She had thyroid ultrasound which shows right lobe slightly larger than left but no nodularity and normal echotexture.    -I suspect that her initial thyroid abnormality was related to serious medical condition dur to her hospitalization and has corrected itself without any further intervention.  She is following with her cardiologist soon and has plans to see her GI soon as well given her dysphagia (although improving recently).     I spent 20 minutes in the care of the patient today including review of labs from Thyroid Function, CMP, and other relevant labs ; imaging/biopsy records (current and previous including abstractions from other facilities); face-to-face time discussing  her lab results and symptoms, medications doses, her options of short and long term treatment based on the latest standards of care / guidelines;   and documenting the encounter.  08/31/2022  participated in the discussions, expressed understanding, and voiced agreement with the above plans.  All questions were answered to her satisfaction. she is encouraged to contact clinic should she have any questions or concerns prior to her return  visit.   FOLLOW UP PLAN:  Return if symptoms worsen or fail to improve.  Ronny Bacon, [facility] [address] [place], [place] [address] Phone:  [phone] Fax: [phone]  11/14/2022, 10:34 AM

## 2022-11-28 ENCOUNTER — Other Ambulatory Visit: Payer: Self-pay | Admitting: Gastroenterology

## 2022-11-28 DIAGNOSIS — R1319 Other dysphagia: Secondary | ICD-10-CM

## 2022-11-28 DIAGNOSIS — K219 Gastro-esophageal reflux disease without esophagitis: Secondary | ICD-10-CM

## 2022-12-13 ENCOUNTER — Ambulatory Visit
Admission: RE | Admit: 2022-12-13 | Discharge: 2022-12-13 | Disposition: A | Discharge status: Home / Self Care | Payer: BC Managed Care – PPO | Source: Ambulatory Visit | Attending: Gastroenterology | Admitting: Gastroenterology

## 2022-12-13 DIAGNOSIS — K219 Gastro-esophageal reflux disease without esophagitis: Secondary | ICD-10-CM

## 2022-12-13 DIAGNOSIS — R1319 Other dysphagia: Secondary | ICD-10-CM

## 2022-12-17 ENCOUNTER — Encounter (HOSPITAL_COMMUNITY): Payer: Self-pay | Admitting: Cardiology

## 2022-12-17 ENCOUNTER — Ambulatory Visit (HOSPITAL_BASED_OUTPATIENT_CLINIC_OR_DEPARTMENT_OTHER)
Admission: RE | Admit: 2022-12-17 | Discharge: 2022-12-17 | Disposition: A | Discharge status: Home / Self Care | Payer: BC Managed Care – PPO | Source: Ambulatory Visit | Attending: Cardiology | Admitting: Cardiology

## 2022-12-17 ENCOUNTER — Ambulatory Visit (HOSPITAL_COMMUNITY)
Admission: RE | Admit: 2022-12-17 | Discharge: 2022-12-17 | Disposition: A | Discharge status: Home / Self Care | Payer: BC Managed Care – PPO | Source: Ambulatory Visit | Attending: Family Medicine | Admitting: Family Medicine

## 2022-12-17 VITALS — BP 90/60 | HR 65 | Wt 131.8 lb

## 2022-12-17 DIAGNOSIS — F32A Depression, unspecified: Secondary | ICD-10-CM | POA: Insufficient documentation

## 2022-12-17 DIAGNOSIS — R112 Nausea with vomiting, unspecified: Secondary | ICD-10-CM | POA: Insufficient documentation

## 2022-12-17 DIAGNOSIS — D649 Anemia, unspecified: Secondary | ICD-10-CM | POA: Diagnosis not present

## 2022-12-17 DIAGNOSIS — F419 Anxiety disorder, unspecified: Secondary | ICD-10-CM | POA: Diagnosis not present

## 2022-12-17 DIAGNOSIS — I5022 Chronic systolic (congestive) heart failure: Secondary | ICD-10-CM

## 2022-12-17 DIAGNOSIS — R000 Tachycardia, unspecified: Secondary | ICD-10-CM | POA: Diagnosis not present

## 2022-12-17 LAB — BASIC METABOLIC PANEL
Anion gap: 8 (ref 5–15)
BUN: 9 mg/dL (ref 6–20)
CO2: 26 mmol/L (ref 22–32)
Calcium: 9.2 mg/dL (ref 8.9–10.3)
Chloride: 103 mmol/L (ref 98–111)
Creatinine, Ser: 0.63 mg/dL (ref 0.44–1.00)
GFR, Estimated: >60 mL/min (ref >60)
Glucose, Bld: 94 mg/dL (ref 70–99)
Potassium: 3.7 mmol/L (ref 3.5–5.1)
Sodium: 137 mmol/L (ref 135–145)

## 2022-12-17 LAB — ECHOCARDIOGRAM COMPLETE
Area-P 1/2: 3.5 cm2
Calc EF: 49.1 %
MV M vel: 1.74 m/s
MV Peak grad: 12.1 mmHg
MV VTI: 1.3 cm2
S' Lateral: 3.1 cm
Single Plane A2C EF: 36.2 %
Single Plane A4C EF: 59.4 %

## 2022-12-17 MED ORDER — PERFLUTREN LIPID MICROSPHERE
1.0000 mL | INTRAVENOUS | Status: DC | PRN
  Administered 2022-12-17: 2 mL via INTRAVENOUS
  Filled 2022-12-17: qty 10

## 2022-12-17 NOTE — Patient Instructions (Signed)
STOP Jardiance   Labs done today, your results will be available in MyChart, we will contact you for abnormal readings.  STAY WELL HYDRATED   Your physician recommends that you schedule a follow-up appointment in: 3 months  If you have any questions or concerns before your next appointment please send [place] a message through [place] or call our office at [phone].    TO LEAVE A MESSAGE FOR THE NURSE SELECT OPTION 2, PLEASE LEAVE A MESSAGE INCLUDING: YOUR NAME DATE OF BIRTH CALL BACK NUMBER REASON FOR CALL**this is important as we prioritize the call backs  YOU WILL RECEIVE A CALL BACK THE SAME DAY AS LONG AS YOU CALL BEFORE 4:00 PM  At the [facility], you and your health needs are our priority. As part of our continuing mission to provide you with exceptional heart care, we have created designated Provider Care Teams. These Care Teams include your primary Cardiologist (physician) and Advanced Practice Providers (APPs- Physician Assistants and Nurse Practitioners) who all work together to provide you with the care you need, when you need it.   You may see any of the following providers on your designated Care Team at your next follow up: Dr Glori Bickers Dr Loralie Champagne Dr. Roxana Hires, NP Lyda Jester, [place] [facility] [place], [place] Forestine Na, NP Audry Riles, PharmD   Please be sure to bring in all your medications bottles to every appointment.

## 2022-12-17 NOTE — Progress Notes (Signed)
  Echocardiogram 2D Echocardiogram has been performed.  Audrey Callahan 12/17/2022, 12:10 PM

## 2022-12-17 NOTE — Progress Notes (Signed)
ADVANCED HF CLINIC CONSULT NOTE   Primary Care: Kristen Loader, FNP HF Cardiologist: Dr. Aundra Dubin  HPI: Audrey Callahan is a 22 y.o. female with history of anxiety and depression.    She underwent septoplasty on 08/28/22. Once home, had N/V and returned to ED. Found to by hypoxic with bilateral diffuse infiltrates on CXR and CT, concerning for ARDS from aspiration PNA. Started on IV abx, eventually decompensated and required vasopressors. Echo showed EF 35-40%, RV okay, no significant valvular disease, IVC not dilated. AHF consulted. cMRI showed LVEF 46%, mild hypokinesis in all mid-ventricular wall segments, no LGE.  Hs-Trop elevated but no chest pain & ECG normal; felt related to demand ischemia, so ischemic work up deferred. 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 Started on GDMT after vasopressors weaned. Discharged home, weight 120 lbs.    Follow up 9/23, low dose spiro added. This was later stopped 2/2 increased fatigue and dizziness.  Echo was done today and reviewed, EF 74%, normal diastolic function, normal RV.   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  Main complaint is elevated HR with exercise.  She states that her HR rises rapidly with exercise, up to 160s-200s on her Apple Watch.  She gets winded easily with heavy exertion.  She will note lightheadedness with standing at times.  Baseline HR is in the 60s, she does not get tachypalpitations at rest.  No syncope/presyncope.   ECG (personally reviewed): NSR, iRBB  Labs (9/23): K 3.6, creatinine 0.55 Labs (10/23): K 4.2, creatinine 0.71, BNP 7.8  PMH: 1. Anxiety/depression 2. 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  This occurred after septoplasty in 9/23 with suspected aspiration post-op.   - Echo (9/23): EF 35-40%, RV okay, no significant valvular disease - cMRI (9/23): LVEF 46%, hypokinesis of all mid-ventricular wall  segments, RVEF 48%, no LGE, possible Takotsubo CM. - Echo (11/23): EF 25%, normal diastolic function, normal RV.   ROS: All systems reviewed and negative except as per HPI.    Current Outpatient Medications  Medication Sig Dispense Refill   buPROPion (WELLBUTRIN XL) 150 MG 24 hr tablet Take 150 mg by mouth every morning.     Cholecalciferol (VITAMIN D) 50 MCG (2000 UT) tablet Take 2,000 Units by mouth daily.     drospirenone-ethinyl estradiol (YAZ) 3-0.02 MG tablet Take 1 tablet by mouth daily.     famotidine (PEPCID) 20 MG tablet Take 20 mg by mouth at bedtime.     ferrous sulfate 325 (65 FE) MG tablet Take 325 mg by mouth daily.     ibuprofen (ADVIL) 200 MG tablet Take 400 mg by mouth every 6 (six) hours as needed for moderate pain.     lactase (LACTAID) 3000 units tablet Take 3,000 Units by mouth daily as needed (lactose intolerance).     Melatonin 5 MG CAPS Take 5 mg by mouth at bedtime as needed (sleep).     metoprolol succinate (TOPROL-XL) 25 MG 24 hr tablet Take 1 tablet (25 mg total) by mouth at bedtime. 90 tablet 1   omeprazole (PRILOSEC OTC) 20 MG tablet Take 20 mg by mouth daily as needed (acid reflux).     Probiotic Product (PROBIOTIC PO) Take 1 capsule by mouth daily.     QUEtiapine (SEROQUEL) 100 MG tablet Take 100 mg by mouth at bedtime. Take with 50 mg to equal 150 mg at bedtime     QUEtiapine (SEROQUEL) 50 MG tablet Take 50 mg by mouth at bedtime. Take with 100  mg to equal 150 mg at bedtime     No current facility-administered medications for this encounter.   Allergies  Allergen Reactions   Lactose Intolerance (Gi)     Upset stomach    Lidocaine Viscous Hcl Nausea And Vomiting   Red Dye Nausea Only and Rash   Social History   Socioeconomic History   Marital status: Single    Spouse name: Not on file   Number of children: Not on file   Years of education: Not on file   Highest education level: Not on file  Occupational History   Not on file  Tobacco Use    Smoking status: Never    Passive exposure: Never   Smokeless tobacco: Never  Vaping Use   Vaping Use: Never used  Substance and Sexual Activity   Alcohol use: Yes    Alcohol/week: 3.0 standard drinks of alcohol    Types: 3 Glasses of wine per week    Comment: pt reports 2-3 drinks twice a month   Drug use: Never   Sexual activity: Yes    Birth control/protection: Pill  Other Topics Concern   Not on file  Social History Narrative   Not on file   Social Determinants of Health   Financial Resource Strain: Not on file  Food Insecurity: No Food Insecurity (09/13/2022)   Hunger Vital Sign    Worried About Running Out of Food in the Last Year: Never true    Ran Out of Food in the Last Year: Never true  Transportation Needs: No Transportation Needs (09/13/2022)   PRAPARE - Administrator, Civil Service (Medical): No    Lack of Transportation (Non-Medical): No  Physical Activity: Not on file  Stress: Not on file  Social Connections: Not on file  Intimate Partner Violence: Not on file   Family History  Adopted: Yes   BP 90/60   Pulse 65   Wt 59.8 kg (131 lb 12.8 oz)   SpO2 94%   BMI 22.62 kg/m   Wt Readings from Last 3 Encounters:  12/17/22 59.8 kg (131 lb 12.8 oz)  11/14/22 58.4 kg (128 lb 12.8 oz)  10/09/22 59.4 kg (131 lb)   PHYSICAL EXAM: General: NAD Neck: No JVD, no thyromegaly or thyroid nodule.  Lungs: Clear to auscultation bilaterally with normal respiratory effort. CV: Nondisplaced PMI.  Heart regular S1/S2, no S3/S4, no murmur.  No peripheral edema.  No carotid bruit.  Normal pedal pulses.  Abdomen: Soft, nontender, no hepatosplenomegaly, no distention.  Skin: Intact without lesions or rashes.  Neurologic: Alert and oriented x 3.  Psych: Normal affect. Extremities: No clubbing or cyanosis.  HEENT: Normal.   ASSESSMENT & PLAN: 1.  Chronic systolic CHF: Echo 9/23 with EF 35-40%, normal RV, trivial MR, IVC not dilated. Family history unknown  (adopted).  No ETOH/drugs reported.  Has never been pregnant. Cardiac MRI (9/23) LVEF 46% mild hypokinesis of all mid-ventricular segments, no LGE/myocarditis. 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  Echo today showed EF 55% with normal diastolic function and normal RV, this lends credence to the diagnosis of Takotsubo CMP. She is not volume overloaded on exam.  - With concern for possible POTS, will stop Jardiance.  - Continue Toprol 25 mg qhs with tachycardia episodes.  2. Sinus tachycardia: Chronic issue for her. Thyroid indices most recently were normal.  This is only noted with exercise, never notes at rest (HR 60s baseline).  Has Apple Watch to monitor.  I suspect she has mild POTS.  Now probably somewhat deconditioned as well as has not been exercising as much as in the past.   - Continue beta blocker for now, she tolerates well.  - Stay well hydrated, electrolyte drinks ok.  Will stop Jardiance due to sodium loss with this. - She needs to get back to regular exercise, this will help.  Discussed regular aerobic activity 5-6 days/week.  - If exertional tachycardia remains an issue, can consider adding ivabradine or replacing Toprol XL with ivabradine.   Followup 3-4 months to reassess POTS-type symptoms.   Loralie Champagne 12/17/2022

## 2023-01-22 ENCOUNTER — Other Ambulatory Visit: Payer: Self-pay | Admitting: Family Medicine

## 2023-02-18 ENCOUNTER — Ambulatory Visit (HOSPITAL_COMMUNITY)
Admission: RE | Admit: 2023-02-18 | Discharge: 2023-02-18 | Disposition: A | Discharge status: Home / Self Care | Payer: BC Managed Care – PPO | Source: Ambulatory Visit | Attending: Cardiology | Admitting: Cardiology

## 2023-02-18 VITALS — BP 110/81 | HR 78 | Wt 132.4 lb

## 2023-02-18 DIAGNOSIS — F419 Anxiety disorder, unspecified: Secondary | ICD-10-CM | POA: Insufficient documentation

## 2023-02-18 DIAGNOSIS — Z79899 Other long term (current) drug therapy: Secondary | ICD-10-CM | POA: Insufficient documentation

## 2023-02-18 DIAGNOSIS — F32A Depression, unspecified: Secondary | ICD-10-CM | POA: Diagnosis not present

## 2023-02-18 DIAGNOSIS — I5022 Chronic systolic (congestive) heart failure: Secondary | ICD-10-CM | POA: Diagnosis present

## 2023-02-18 DIAGNOSIS — G90A Postural orthostatic tachycardia syndrome (POTS): Secondary | ICD-10-CM | POA: Diagnosis not present

## 2023-02-18 MED ORDER — METOPROLOL SUCCINATE ER 25 MG PO TB24: 25.0000 mg | ORAL_TABLET | Freq: Every evening | ORAL | 3 refills | Status: DC

## 2023-02-18 NOTE — Progress Notes (Signed)
ADVANCED HF CLINIC CONSULT NOTE   Primary Care: Kristen Loader, FNP HF Cardiologist: Dr. Aundra Dubin  HPI: Audrey Callahan is a 22 y.o. female with history of anxiety and depression.    She underwent septoplasty on 08/28/22. Once home, had N/V and returned to ED. Found to by hypoxic with bilateral diffuse infiltrates on CXR and CT, concerning for ARDS from aspiration PNA. Started on IV abx, eventually decompensated and required vasopressors. Echo showed EF 35-40%, RV okay, no significant valvular disease, IVC not dilated. AHF consulted. cMRI showed LVEF 46%, mild hypokinesis in all mid-ventricular wall segments, no LGE.  Hs-Trop elevated but no chest pain & ECG normal; felt related to demand ischemia, so ischemic work up deferred. 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 Started on GDMT after vasopressors weaned. Discharged home, weight 120 lbs.    Follow up 9/23, low dose spiro added. This was later stopped 2/2 increased fatigue and dizziness.  Echo in 11/23 showed EF [id number], normal diastolic function, normal RV.   She returns today for followup of suspected Takotsubo cardiomyopathy and suspected POTS.  She is doing better now.  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  She no longer reports large heart rate excursions.  No lightheadedness or palpitations.  No dyspnea or chest pain. Drinking a lot of fluid with exercise.  Occasional GERD-type symptoms.   ECG (personally reviewed): sinus arrhythmia, iRBBB  Labs (9/23): K 3.6, creatinine 0.55 Labs (10/23): K 4.2, creatinine 0.71, BNP 7.8  PMH: 1. Anxiety/depression 2. 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  This occurred after septoplasty in 9/23 with suspected aspiration post-op.   - Echo (9/23): EF 35-40%, RV okay, no significant valvular disease - cMRI (9/23): LVEF 46%, hypokinesis of all mid-ventricular wall segments, RVEF 48%,  no LGE, possible Takotsubo CM. - Echo (11/23): EF [id number], normal diastolic function, normal RV.  3. GERD 4. Possible mild POTS  ROS: All systems reviewed and negative except as per HPI.    Current Outpatient Medications  Medication Sig Dispense Refill   buPROPion (WELLBUTRIN XL) 150 MG 24 hr tablet Take 150 mg by mouth every morning.     Cholecalciferol (VITAMIN D) 50 MCG (2000 UT) tablet Take 2,000 Units by mouth daily.     drospirenone-ethinyl estradiol (YAZ) 3-0.02 MG tablet Take 1 tablet by mouth daily.     famotidine (PEPCID) 20 MG tablet Take 20 mg by mouth at bedtime.     ferrous sulfate 325 (65 FE) MG tablet Take 325 mg by mouth daily.     ibuprofen (ADVIL) 200 MG tablet Take 400 mg by mouth every 6 (six) hours as needed for moderate pain.     lactase (LACTAID) 3000 units tablet Take 3,000 Units by mouth daily as needed (lactose intolerance).     Melatonin 5 MG CAPS Take 5 mg by mouth at bedtime as needed (sleep).     omeprazole (PRILOSEC OTC) 20 MG tablet Take 20 mg by mouth daily as needed (acid reflux).     Probiotic Product (PROBIOTIC PO) Take 1 capsule by mouth daily.     QUEtiapine (SEROQUEL) 100 MG tablet Take 100 mg by mouth at bedtime. Take with 50 mg to equal 150 mg at bedtime     QUEtiapine (SEROQUEL) 50 MG tablet Take 50 mg by mouth at bedtime. Take with 100 mg to equal 150 mg at bedtime     metoprolol succinate (TOPROL-XL) 25 MG 24 hr tablet Take 1 tablet (25 mg total) by  mouth at bedtime. 90 tablet 3   No current facility-administered medications for this encounter.   Allergies  Allergen Reactions   Lactose Intolerance (Gi)     Upset stomach    Lidocaine Viscous Hcl Nausea And Vomiting   Red Dye Nausea Only and Rash   Social History   Socioeconomic History   Marital status: Single    Spouse name: Not on file   Number of children: Not on file   Years of education: Not on file   Highest education level: Not on file  Occupational History   Not on file   Tobacco Use   Smoking status: Never    Passive exposure: Never   Smokeless tobacco: Never  Vaping Use   Vaping Use: Never used  Substance and Sexual Activity   Alcohol use: Yes    Alcohol/week: 3.0 standard drinks of alcohol    Types: 3 Glasses of wine per week    Comment: pt reports 2-3 drinks twice a month   Drug use: Never   Sexual activity: Yes    Birth control/protection: Pill  Other Topics Concern   Not on file  Social History Narrative   Not on file   Social Determinants of Health   Financial Resource Strain: Not on file  Food Insecurity: No Food Insecurity (09/13/2022)   Hunger Vital Sign    Worried About Running Out of Food in the Last Year: Never true    Ran Out of Food in the Last Year: Never true  Transportation Needs: No Transportation Needs (09/13/2022)   PRAPARE - Hydrologist (Medical): No    Lack of Transportation (Non-Medical): No  Physical Activity: Not on file  Stress: Not on file  Social Connections: Not on file  Intimate Partner Violence: Not on file   Family History  Adopted: Yes   BP 110/81   Pulse 78   Wt 60.1 kg (132 lb 6.4 oz)   SpO2 98%   BMI 22.73 kg/m   Wt Readings from Last 3 Encounters:  02/18/23 60.1 kg (132 lb 6.4 oz)  12/17/22 59.8 kg (131 lb 12.8 oz)  11/14/22 58.4 kg (128 lb 12.8 oz)   PHYSICAL EXAM: General: NAD Neck: No JVD, no thyromegaly or thyroid nodule.  Lungs: Clear to auscultation bilaterally with normal respiratory effort. CV: Nondisplaced PMI.  Heart regular S1/S2, no S3/S4, no murmur.  No peripheral edema.  No carotid bruit.  Normal pedal pulses.  Abdomen: Soft, nontender, no hepatosplenomegaly, no distention.  Skin: Intact without lesions or rashes.  Neurologic: Alert and oriented x 3.  Psych: Normal affect. Extremities: No clubbing or cyanosis.  HEENT: Normal.   ASSESSMENT & PLAN: 1.  Chronic systolic CHF: Echo [id number] with EF 35-40%, normal RV, trivial MR, IVC not dilated.  Family history unknown (adopted).  No ETOH/drugs reported.  Has never been pregnant. Cardiac MRI (9/23) LVEF 46% mild hypokinesis of all mid-ventricular segments, no LGE/myocarditis. 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  Echo in 11/23 showed resolution of cardiomyopathy with EF 55% with normal diastolic function and normal RV, this lends credence to the diagnosis of Takotsubo CMP. She is not volume overloaded on exam.  - Continue Toprol 25 mg qhs with tachycardia episodes.  2. Sinus tachycardia: Has been a chronic issue for her. Thyroid indices were normal.  I suspect she has mild POTS.  Improved now with regular exercise.   - Continue beta blocker for now, she tolerates well.  -  Stay well hydrated, electrolyte drinks ok.   - Continue regular aerobic activity 5-6 days/week.   Followup in 1 year.   Loralie Champagne 02/18/2023

## 2023-02-18 NOTE — Patient Instructions (Signed)
There has been no changes to your medications   Your physician recommends that you schedule a follow-up appointment in: 1 year ( March 2025) ** please call the office in January 2025 to arrange your follow up appointment. **  If you have any questions or concerns before your next appointment please send [place] a message through [place] or call our office at [phone].    TO LEAVE A MESSAGE FOR THE NURSE SELECT OPTION 2, PLEASE LEAVE A MESSAGE INCLUDING: YOUR NAME DATE OF BIRTH CALL BACK NUMBER REASON FOR CALL**this is important as we prioritize the call backs  YOU WILL RECEIVE A CALL BACK THE SAME DAY AS LONG AS YOU CALL BEFORE 4:00 PM  At the [facility], you and your health needs are our priority. As part of our continuing mission to provide you with exceptional heart care, we have created designated Provider Care Teams. These Care Teams include your primary Cardiologist (physician) and Advanced Practice Providers (APPs- Physician Assistants and Nurse Practitioners) who all work together to provide you with the care you need, when you need it.   You may see any of the following providers on your designated Care Team at your next follow up: Dr Glori Bickers Dr Loralie Champagne Dr. Roxana Hires, NP Lyda Jester, [place] [facility] [place], [place] Forestine Na, NP Audry Riles, PharmD   Please be sure to bring in all your medications bottles to every appointment.    Thank you for choosing [facility]

## 2023-04-08 ENCOUNTER — Other Ambulatory Visit: Payer: Self-pay | Admitting: Nurse Practitioner

## 2023-04-08 ENCOUNTER — Other Ambulatory Visit (HOSPITAL_COMMUNITY)
Admission: RE | Admit: 2023-04-08 | Discharge: 2023-04-08 | Disposition: A | Discharge status: Home / Self Care | Payer: BC Managed Care – PPO | Source: Ambulatory Visit | Attending: Nurse Practitioner | Admitting: Nurse Practitioner

## 2023-04-08 DIAGNOSIS — Z124 Encounter for screening for malignant neoplasm of cervix: Secondary | ICD-10-CM | POA: Insufficient documentation

## 2023-04-11 LAB — CYTOLOGY - PAP: Diagnosis: NEGATIVE

## 2023-05-06 IMAGING — CT CT ABD-PELV W/ CM
2 of 4 series · 16 of 46 positions shown, 18 images · IV contrast (APPLIED)
Comparison: None.

CLINICAL DATA: Abdominal pain

EXAM:
CT ABDOMEN AND PELVIS WITH CONTRAST
TECHNIQUE: Multidetector CT imaging of the abdomen and pelvis was performed
using the standard protocol following bolus administration of
intravenous contrast.
CONTRAST:  80mL OMNIPAQUE IOHEXOL 300 MG/ML  SOLN

[Series 2: abd pel w · axial · 0.73mm/px · z∈[-380,+20]mm · 13 of 88 slices shown, 15 images]
[im 4/88  soft-tissue]
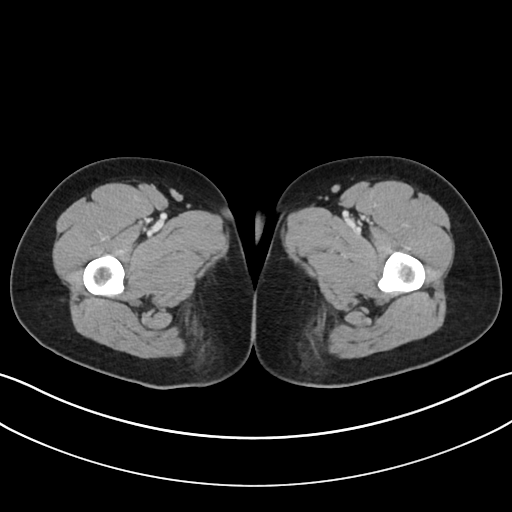
[im 4/88  bone]
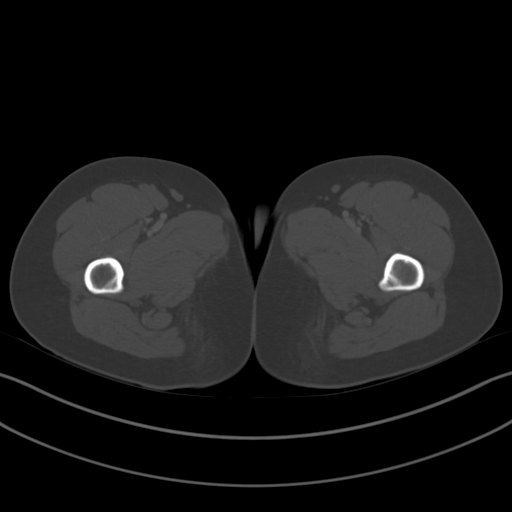
[im 11/88  soft-tissue]
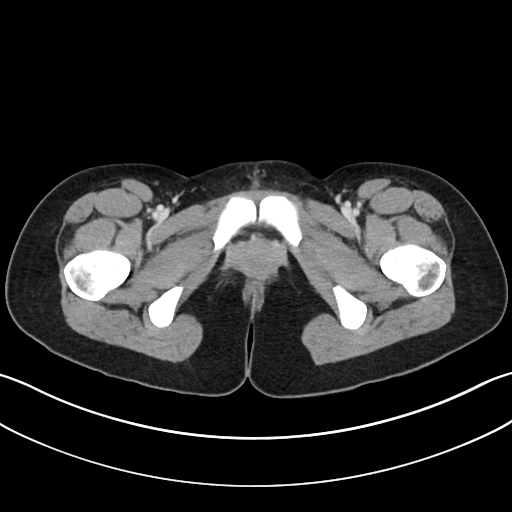
[im 17/88  soft-tissue]
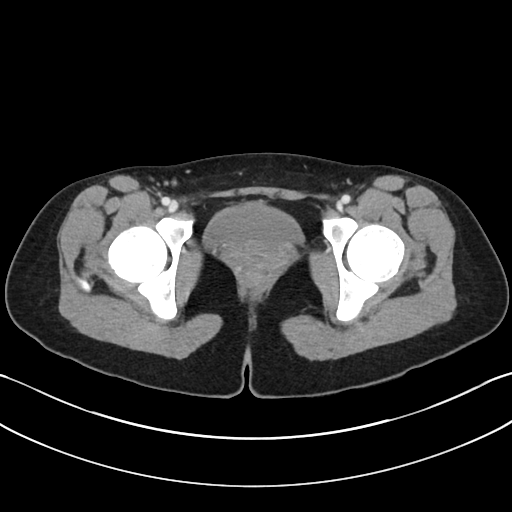
[im 24/88  soft-tissue]
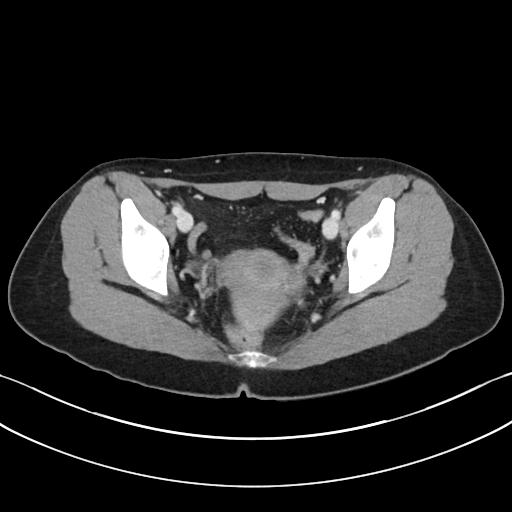
[im 31/88  soft-tissue]
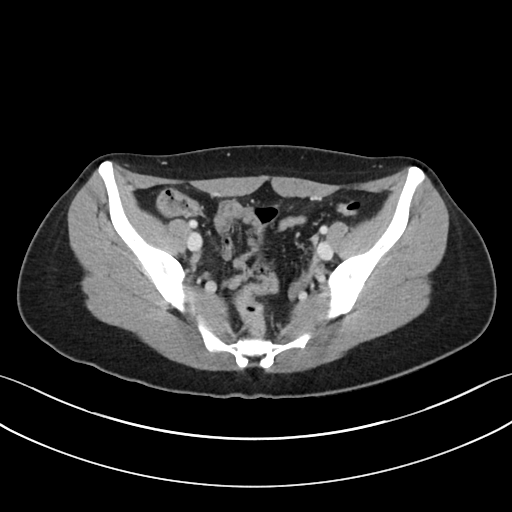
[im 37/88  soft-tissue]
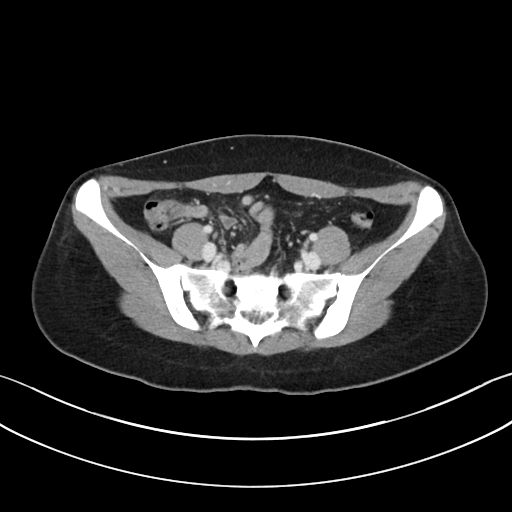
[im 44/88  soft-tissue]
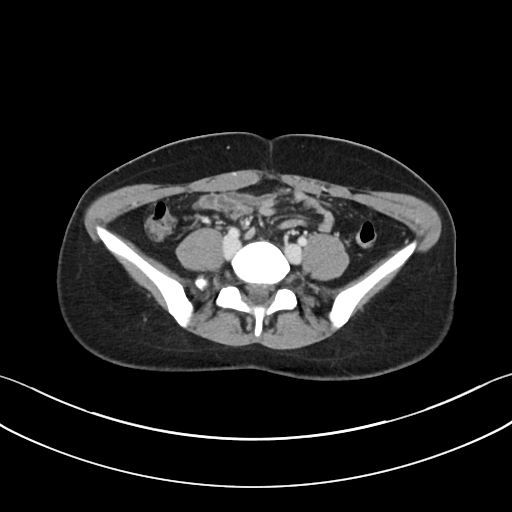
[im 51/88  soft-tissue]
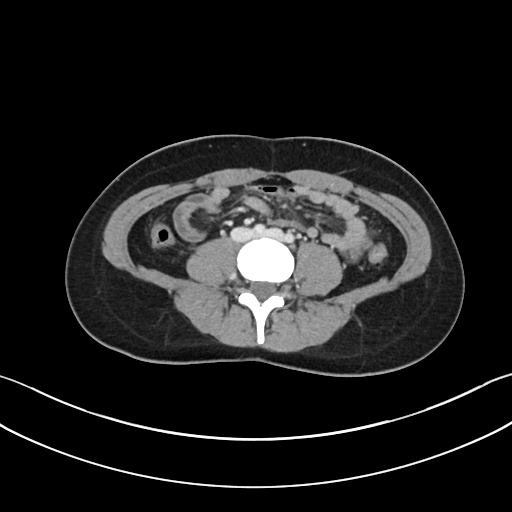
[im 57/88  soft-tissue]
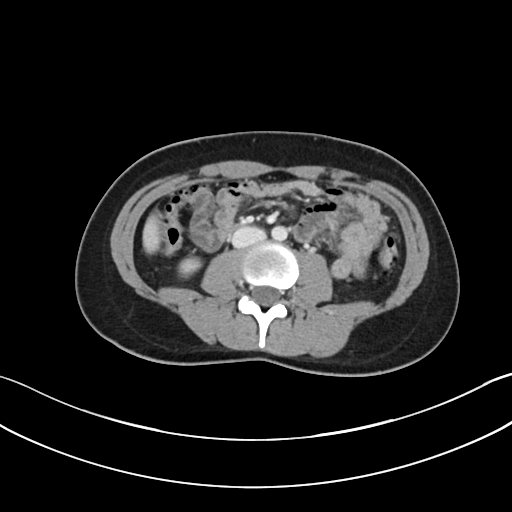
[im 57/88  bone]
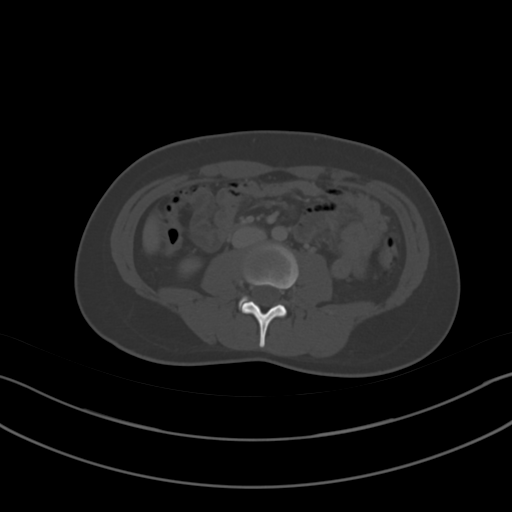
[im 64/88  soft-tissue]
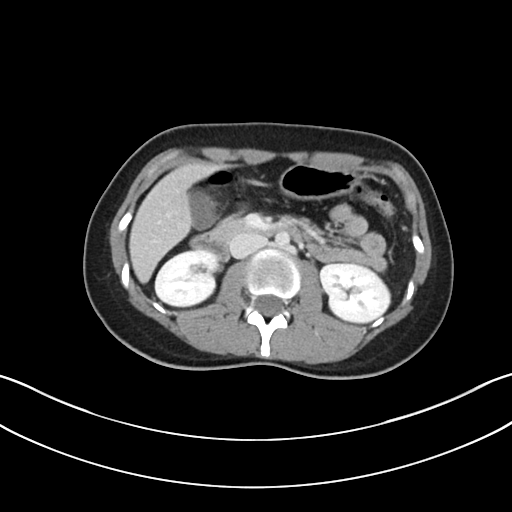
[im 71/88  soft-tissue]
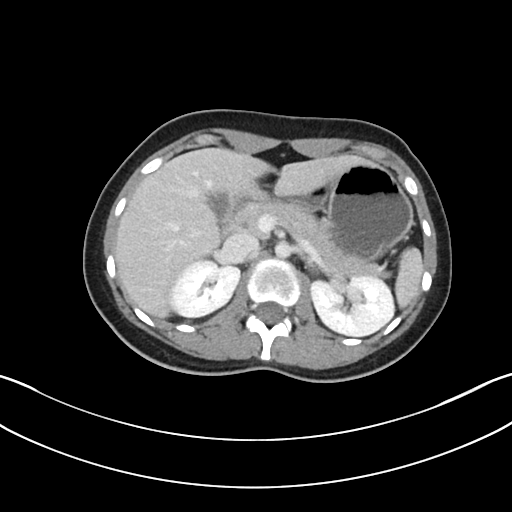
[im 77/88  soft-tissue]
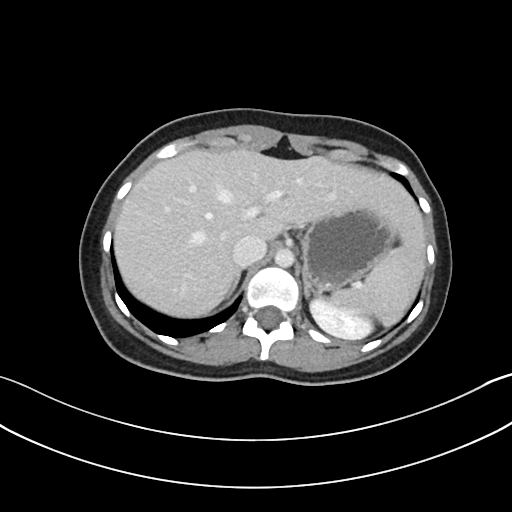
[im 84/88  soft-tissue]
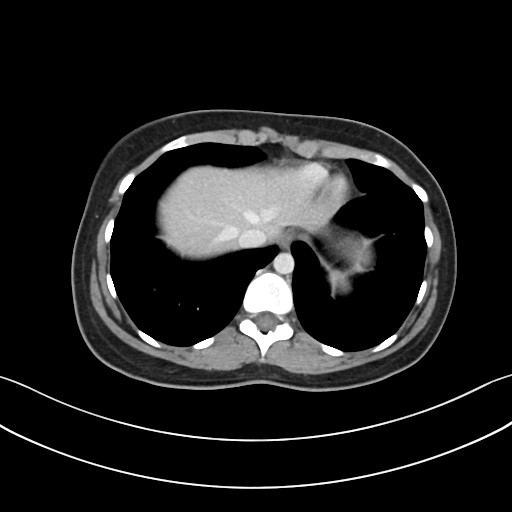

[Series 5: coronal · coronal · 0.72mm/px · 3 of 61 slices shown]
[im 21/61  soft-tissue]
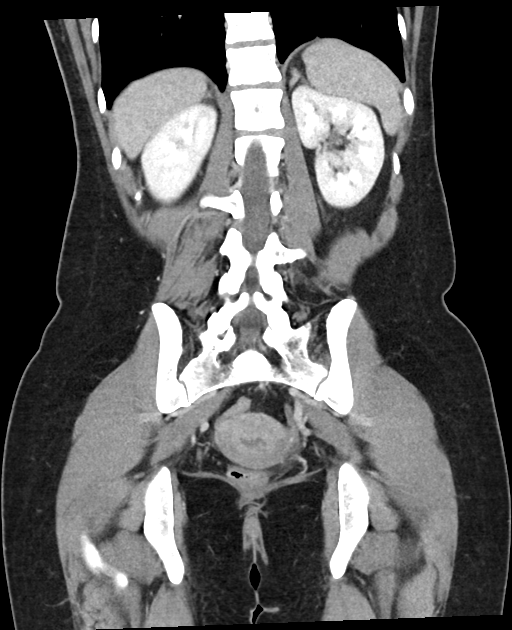
[im 27/61  soft-tissue]
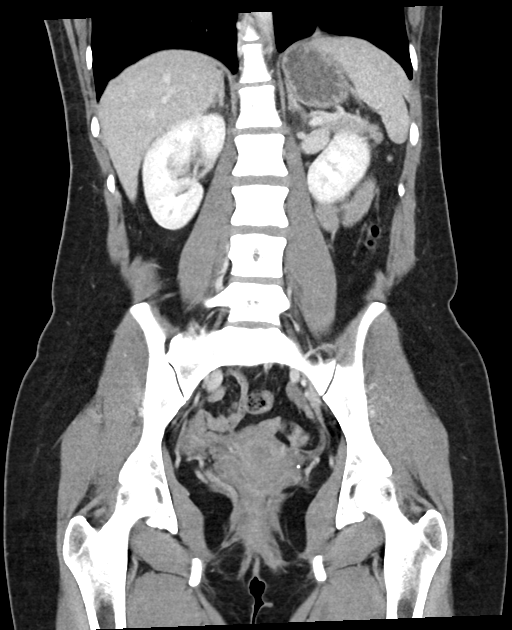
[im 34/61  soft-tissue]
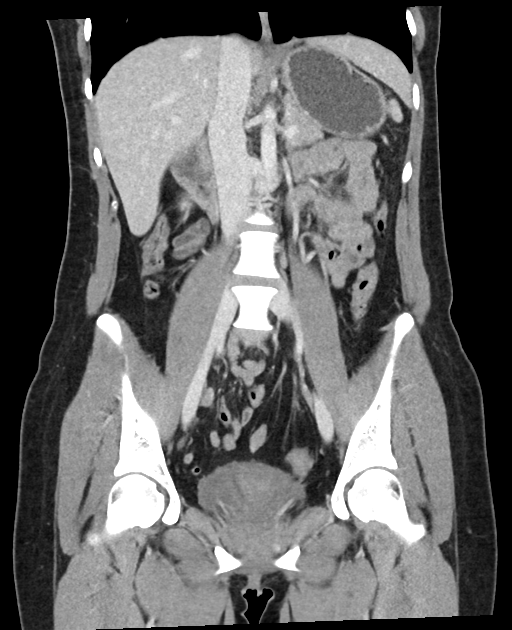

[16 of 46 positions shown; findings below may reference images not displayed]

FINDINGS: Lower chest: Lung bases are clear.

Hepatobiliary: Liver is within normal limits.

Gallbladder is unremarkable. No intrahepatic or extrahepatic ductal
dilatation.

Pancreas: Within normal limits.

Spleen: Within normal limits

Adrenals/Urinary Tract: Adrenal glands are within normal limits.

Kidneys are within normal limits.  No hydronephrosis.

Bladder is within normal limits.

Stomach/Bowel: Stomach is within normal limits.

No evidence of bowel obstruction.

Normal appendix (series 2/image 65).

No colonic wall thickening or inflammatory changes.

Vascular/Lymphatic: No evidence of abdominal aortic aneurysm.

No suspicious abdominopelvic lymphadenopathy.

Reproductive: Retroverted uterus.

Bilateral ovaries are within normal limits.

Other: No abdominopelvic ascites.

Musculoskeletal: Visualized osseous structures are within normal
limits.
IMPRESSION: Negative CT abdomen/pelvis.

## 2023-08-24 IMAGING — CR DG ABDOMEN 1V
1 series · 1 of 1 positions shown · non-contrast
Comparison: CT 09/30/2021

CLINICAL DATA: Three-month history of constipation and diarrhea.

EXAM:
ABDOMEN - 1 VIEW

[t abdomen supine]
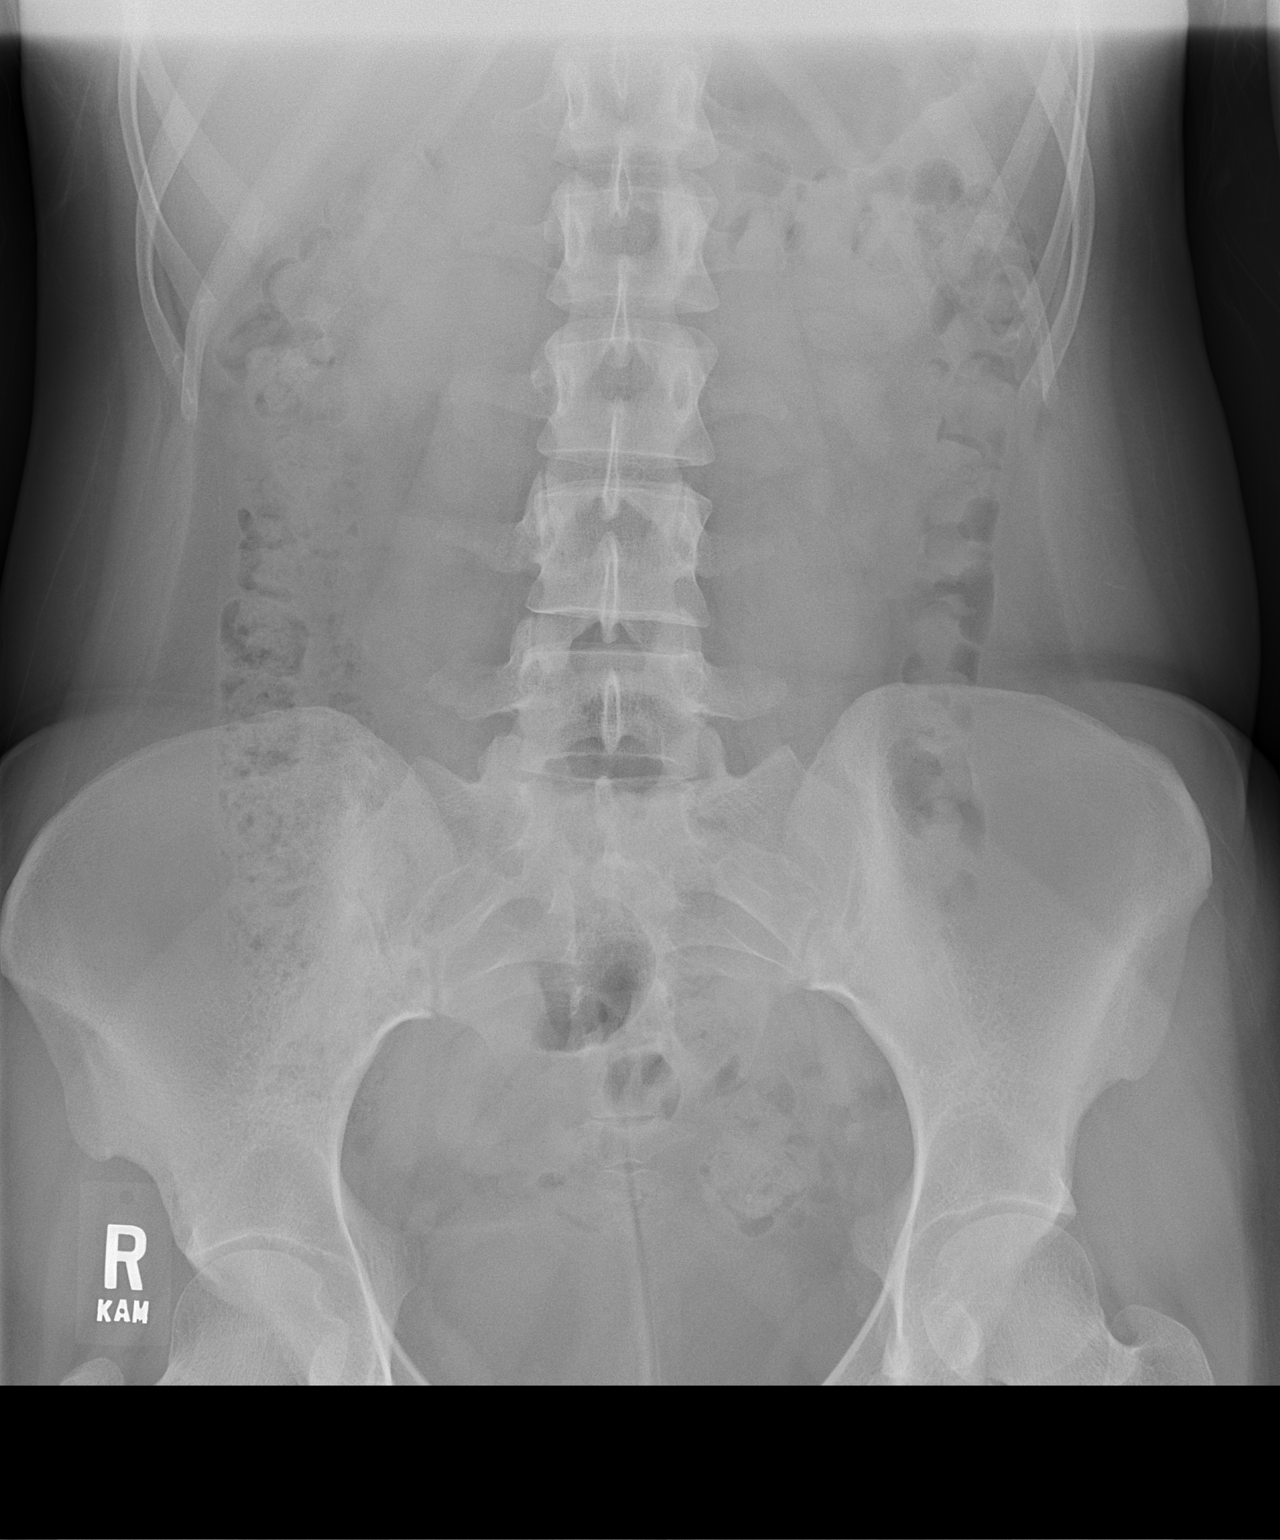

[1 of 1 positions shown; findings below may reference images not displayed]

FINDINGS: Gas and stool demonstrated throughout the colon. No small or large
bowel distention. No radiopaque stones. Visualized bones and soft
tissue contours appear intact.
IMPRESSION: Normal nonobstructive bowel gas pattern with scattered stool
throughout the colon.

## 2023-08-25 ENCOUNTER — Encounter (HOSPITAL_BASED_OUTPATIENT_CLINIC_OR_DEPARTMENT_OTHER): Payer: Self-pay | Admitting: Emergency Medicine

## 2023-08-25 ENCOUNTER — Other Ambulatory Visit (HOSPITAL_BASED_OUTPATIENT_CLINIC_OR_DEPARTMENT_OTHER): Payer: Self-pay

## 2023-08-25 ENCOUNTER — Emergency Department (HOSPITAL_BASED_OUTPATIENT_CLINIC_OR_DEPARTMENT_OTHER)
Admission: EM | Admit: 2023-08-25 | Discharge: 2023-08-25 | Disposition: A | Discharge status: Home / Self Care | Payer: BC Managed Care – PPO

## 2023-08-25 ENCOUNTER — Other Ambulatory Visit: Payer: Self-pay

## 2023-08-25 DIAGNOSIS — K625 Hemorrhage of anus and rectum: Secondary | ICD-10-CM | POA: Insufficient documentation

## 2023-08-25 DIAGNOSIS — I509 Heart failure, unspecified: Secondary | ICD-10-CM | POA: Insufficient documentation

## 2023-08-25 DIAGNOSIS — J45909 Unspecified asthma, uncomplicated: Secondary | ICD-10-CM | POA: Diagnosis not present

## 2023-08-25 DIAGNOSIS — R300 Dysuria: Secondary | ICD-10-CM | POA: Diagnosis present

## 2023-08-25 HISTORY — DX: Postural orthostatic tachycardia syndrome (POTS): G90.A

## 2023-08-25 LAB — BASIC METABOLIC PANEL
Anion gap: 7 (ref 5–15)
BUN: 8 mg/dL (ref 6–20)
CO2: 27 mmol/L (ref 22–32)
Calcium: 8.9 mg/dL (ref 8.9–10.3)
Chloride: 103 mmol/L (ref 98–111)
Creatinine, Ser: 0.56 mg/dL (ref 0.44–1.00)
GFR, Estimated: >60 mL/min (ref >60)
Glucose, Bld: 88 mg/dL (ref 70–99)
Potassium: 3.7 mmol/L (ref 3.5–5.1)
Sodium: 137 mmol/L (ref 135–145)

## 2023-08-25 LAB — URINALYSIS, ROUTINE W REFLEX MICROSCOPIC
Bacteria, UA: NONE SEEN
Bilirubin Urine: NEGATIVE
Glucose, UA: NEGATIVE mg/dL
Hgb urine dipstick: NEGATIVE
Ketones, ur: NEGATIVE mg/dL
Nitrite: NEGATIVE
Protein, ur: NEGATIVE mg/dL
Specific Gravity, Urine: 1.012 (ref 1.005–1.030)
pH: 8 (ref 5.0–8.0)

## 2023-08-25 LAB — CBC
HCT: 39.8 % (ref 36.0–46.0)
Hemoglobin: 13.3 g/dL (ref 12.0–15.0)
MCH: 28.7 pg (ref 26.0–34.0)
MCHC: 33.4 g/dL (ref 30.0–36.0)
MCV: 85.8 fL (ref 80.0–100.0)
Platelets: 320 10*3/uL (ref 150–400)
RBC: 4.64 MIL/uL (ref 3.87–5.11)
RDW: 12.6 % (ref 11.5–15.5)
WBC: 8.3 10*3/uL (ref 4.0–10.5)
nRBC: 0 % (ref 0.0–0.2)

## 2023-08-25 LAB — PREGNANCY, URINE: Preg Test, Ur: NEGATIVE

## 2023-08-25 LAB — OCCULT BLOOD X 1 CARD TO LAB, STOOL: Fecal Occult Bld: POSITIVE — AB

## 2023-08-25 MED ORDER — CEFADROXIL 500 MG PO CAPS
500.0000 mg | ORAL_CAPSULE | Freq: Two times a day (BID) | ORAL | 0 refills | Status: DC
  Filled 2023-08-25: qty 12, 6d supply, fill #0

## 2023-08-25 MED ORDER — POLYETHYLENE GLYCOL 3350 17 GM/SCOOP PO POWD
1.0000 | Freq: Once | ORAL | 0 refills | Status: AC
  Filled 2023-08-25: qty 238, 1d supply, fill #0

## 2023-08-25 NOTE — ED Provider Notes (Signed)
[place] EMERGENCY DEPARTMENT AT [facility] Provider Note   CSN: [id number] Arrival date & time: 08/25/23  1306     History  Chief Complaint  Patient presents with   Hematuria    Audrey Callahan is a 22 y.o. female.   Hematuria   22 year old female presents emergency department with complaints of dysuria as well as urinary frequency and "bladder pressure."  Patient states she has been with symptoms for the past 4 days or so and it feels somewhat similar to prior urinary tract infections.  States that the day, had 1 bowel movement where she noticed blood in the stool streaky as well as upon wiping.  Reports some discomfort while having a bowel movement.  States she had similar symptoms a few years ago that resolved in a day.  Has had no persistent bleeding since bowel movement earlier.  Denies any fever, chills, flank pain, chest pain, shortness of breath, vaginal symptoms.  Past medical history significant for CHF, POTS, asthma, anxiety  Home Medications Prior to Admission medications   Medication Sig Start Date End Date Taking? Authorizing Provider  cefadroxil (DURICEF) 500 MG capsule Take 1 capsule (500 mg total) by mouth 2 (two) times daily. 08/25/23  Yes Peter Garter, PA  polyethylene glycol powder (GLYCOLAX/MIRALAX) 17 GM/SCOOP powder use as directed 08/25/23 08/26/23 Yes Sherian Maroon A, PA  buPROPion (WELLBUTRIN XL) 150 MG 24 hr tablet Take 150 mg by mouth every morning. 07/31/22   [provider]  Cholecalciferol (VITAMIN D) 50 MCG (2000 UT) tablet Take 2,000 Units by mouth daily.    [provider]  drospirenone-ethinyl estradiol (YAZ) 3-0.02 MG tablet Take 1 tablet by mouth daily. 06/24/22   [provider]  famotidine (PEPCID) 20 MG tablet Take 20 mg by mouth at bedtime. 08/11/22   [provider]  ferrous sulfate 325 (65 FE) MG tablet Take 325 mg by mouth daily.    [provider]  ibuprofen (ADVIL) 200 MG tablet Take  400 mg by mouth every 6 (six) hours as needed for moderate pain.    [provider]  lactase (LACTAID) 3000 units tablet Take 3,000 Units by mouth daily as needed (lactose intolerance).    [provider]  Melatonin 5 MG CAPS Take 5 mg by mouth at bedtime as needed (sleep).    [provider]  metoprolol succinate (TOPROL-XL) 25 MG 24 hr tablet Take 1 tablet (25 mg total) by mouth at bedtime. 02/18/23   Laurey Morale, MD  omeprazole (PRILOSEC OTC) 20 MG tablet Take 20 mg by mouth daily as needed (acid reflux).    [provider]  Probiotic Product (PROBIOTIC PO) Take 1 capsule by mouth daily.    [provider]  QUEtiapine (SEROQUEL) 100 MG tablet Take 100 mg by mouth at bedtime. Take with 50 mg to equal 150 mg at bedtime 06/19/22   [provider]  QUEtiapine (SEROQUEL) 50 MG tablet Take 50 mg by mouth at bedtime. Take with 100 mg to equal 150 mg at bedtime 08/08/22   [provider]      Allergies    Lactose intolerance (gi), Lidocaine viscous hcl, and Red dye #40 (allura red)    Review of Systems   Review of Systems  Genitourinary:  Positive for hematuria.  All other systems reviewed and are negative.   Physical Exam Updated Vital Signs BP 106/70 (BP Location: Right Arm)   Pulse 66   Temp 98.5 F (36.9 C) (Oral)  Resp 16   Ht 5\' 4"  (1.626 m)   Wt 60.1 kg   LMP 08/11/2023 (Approximate)   SpO2 100%   BMI 22.74 kg/m  Physical Exam Vitals and nursing note reviewed. Exam conducted with a chaperone present.  Constitutional:      General: She is not in acute distress.    Appearance: She is well-developed.  HENT:     Head: Normocephalic and atraumatic.  Eyes:     Conjunctiva/sclera: Conjunctivae normal.  Cardiovascular:     Rate and Rhythm: Normal rate and regular rhythm.     Heart sounds: No murmur heard. Pulmonary:     Effort: Pulmonary effort is normal. No respiratory distress.     Breath sounds: Normal breath  sounds.  Abdominal:     Palpations: Abdomen is soft.     Tenderness: There is abdominal tenderness.     Comments: Prepubic tenderness to palpation.  Genitourinary:    Comments: GU exam performed with nursing staff at bedside.  External exam showed no evidence of external hemorrhoid.  No active bleeding.  Small amount of brown stool was obtained without palpable fissure, mass in rectum. Musculoskeletal:        General: No swelling.     Cervical back: Neck supple.  Skin:    General: Skin is warm and dry.     Capillary Refill: Capillary refill takes less than 2 seconds.  Neurological:     Mental Status: She is alert.  Psychiatric:        Mood and Affect: Mood normal.     ED Results / Procedures / Treatments   Labs (all labs ordered are listed, but only abnormal results are displayed) Labs Reviewed  URINALYSIS, ROUTINE W REFLEX MICROSCOPIC - Abnormal; Notable for the following components:      Result Value   Leukocytes,Ua MODERATE (*)    All other components within normal limits  OCCULT BLOOD X 1 CARD TO LAB, STOOL - Abnormal; Notable for the following components:   Fecal Occult Bld POSITIVE (*)    All other components within normal limits  PREGNANCY, URINE  BASIC METABOLIC PANEL  CBC    EKG None  Radiology No results found.  Procedures Procedures    Medications Ordered in ED Medications - No data to display  ED Course/ Medical Decision Making/ A&P                                 Medical Decision Making Amount and/or Complexity of Data Reviewed Labs: ordered.  Risk Prescription drug management.   This patient presents to the ED for concern of urinary symptoms, hematochezia, this involves an extensive number of treatment options, and is a complaint that carries with it a high risk of complications and morbidity.  The differential diagnosis includes hemorrhoid, fissure, diverticulosis, lower GI bleed, cystitis, pyelonephritis   Co morbidities that complicate  the patient evaluation  See HPI   Additional history obtained:  Additional history obtained from EMR External records from outside source obtained and reviewed including hospital records   Lab Tests:  I Ordered, and personally interpreted labs.  The pertinent results include: No leukocytosis.  No evidence of anemia.  Placed within range.  No electrolyte abnormalities.  No renal dysfunction.  UA significant for 21-50 WBCs with moderate leukocytes but otherwise unremarkable.  Urine pregnancy negative.  Occult positive   Imaging Studies ordered:  na   Cardiac Monitoring: / EKG:  The patient was maintained on a cardiac monitor.  I personally viewed and interpreted the cardiac monitored which showed an underlying rhythm of: Sinus rhythm   Consultations Obtained:  N/A   Problem List / ED Course / Critical interventions / Medication management  Dysuria, hematochezia Reevaluation of the patient showed that the patient stayed the same I have reviewed the patients home medicines and have made adjustments as needed   Social Determinants of Health:  Denies tobacco, illicit drug use   Test / Admission - Considered:  Dysuria, hematochezia Vitals signs within normal range and stable throughout visit. Laboratory studies significant for: See above 22 year old female presents emergency department with a few days history of dysuria, urinary frequency as well as 1 episode of bright red blood per rectum that occurred earlier today.  Guarding urinary symptoms, patient with some suprapubic tenderness on exam with UA significant for leukocytes, white blood cells but with no bacteria.  Given urinary symptoms, will treat empirically and culture urine.  Will recommend follow-up with primary care for reevaluation of urinary symptoms.  Regarding 1 episode of bright red blood per rectum, rectal exam without any gross hematochezia with brown stool obtained for Hemoccult of which was positive.  No  palpable external or internal hemorrhoid but with some increased discomfort on rectal exam.  Patient symptoms could be secondary to possible fissure given reported more constipation over the past several days and more straining to develop a bowel movement.  Will recommend MiraLAX titrated for soft regular bowel movements, follow-up with her GI doctor for reevaluation of symptoms with strict return precautions if symptoms worsen or do not get better.  Patient with [facility] lower GI bleed score of 8 so outpatient follow-up deemed most appropriate.  Treatment plan discussed at length with patient and she acknowledged understanding was agreeable to said plan.  Patient overall well-appearing, afebrile in no acute distress. Worrisome signs and symptoms were discussed with the patient, and the patient acknowledged understanding to return to the ED if noticed. Patient was stable upon discharge.          Final Clinical Impression(s) / ED Diagnoses Final diagnoses:  Dysuria  Rectal bleeding    Rx / DC Orders ED Discharge Orders          Ordered    cefadroxil (DURICEF) 500 MG capsule  2 times daily        08/25/23 1735    polyethylene glycol powder (GLYCOLAX/MIRALAX) 17 GM/SCOOP powder   Once        08/25/23 1738              Peter Garter, PA 08/25/23 1850    Coral Spikes, DO 08/25/23 2335

## 2023-08-25 NOTE — ED Triage Notes (Signed)
Pt via pov fromhome with urinary symptoms x 4 days; hematuria today. Pt reports large amount of blood and clots in toilet and on toilet paper, states she is not menstruating. Pt alert & oriented, nad noted.

## 2023-08-25 NOTE — Discharge Instructions (Signed)
As discussed, your Hemoccult was positive confirming rectal bleeding.  Recommend use of MiraLAX at home to help with regular soft bowel movements.  He may increase if your stools become hard and decrease if you experience diarrhea.  If bleeding does not stop, recommend return to emergency department for reevaluation.  Otherwise, outpatient follow-up with your GI doctor as recommended.  We will treat your urinary symptoms with antibiotics as there is concern for bladder infection.  Please do not hesitate to return to emergency department if the worrisome signs and symptoms we discussed become apparent.

## 2023-08-26 ENCOUNTER — Telehealth (HOSPITAL_BASED_OUTPATIENT_CLINIC_OR_DEPARTMENT_OTHER): Payer: Self-pay | Admitting: Emergency Medicine

## 2023-08-26 ENCOUNTER — Other Ambulatory Visit (HOSPITAL_BASED_OUTPATIENT_CLINIC_OR_DEPARTMENT_OTHER): Payer: Self-pay

## 2023-08-26 MED ORDER — CEFDINIR 300 MG PO CAPS
300.0000 mg | ORAL_CAPSULE | Freq: Two times a day (BID) | ORAL | 0 refills | Status: DC
  Filled 2023-08-26: qty 12, 6d supply, fill #0

## 2023-08-26 MED ORDER — CEFDINIR 300 MG PO CAPS: 300.0000 mg | ORAL_CAPSULE | Freq: Two times a day (BID) | ORAL | 0 refills | Status: DC

## 2023-08-26 NOTE — Telephone Encounter (Cosign Needed)
Patient has allergy to antibiotic prescribed.  Will change antibiotic for treatment of patient's urinary tract infection.

## 2023-08-26 NOTE — Telephone Encounter (Cosign Needed)
Patient needed an updated prescription given allergy to [facility].  Patient given antibiotic for treatment of urinary tract infection.  Will change antibiotic and notify pharmacist.

## 2023-10-08 ENCOUNTER — Telehealth (HOSPITAL_BASED_OUTPATIENT_CLINIC_OR_DEPARTMENT_OTHER): Payer: Self-pay | Admitting: *Deleted

## 2023-10-08 NOTE — Telephone Encounter (Signed)
Pre-operative Risk Assessment    Patient Name: Audrey Callahan  DOB: 16-Mar-2001 MRN: [id number]      Request for Surgical Clearance    Procedure:   Colonoscopy/Endoscopy  Date of Surgery:  Clearance 11/21/23                                 Surgeon:  Dr. Liliane Shi Surgeon's Group or Practice Name:  [facility] Phone number:  [phone] Fax number:  [phone]   Type of Clearance Requested:   - Medical    Type of Anesthesia:   Propofol   Additional requests/questions:    Signed, Emmit Pomfret   10/08/2023, 7:46 AM

## 2023-10-08 NOTE — Telephone Encounter (Signed)
Pt see's Dr. Shirlee Latch.   Tried to call the pt to set up tele appt though VM is not set and could not leave message to call back.

## 2023-10-08 NOTE — Telephone Encounter (Signed)
Name: Audrey Callahan  DOB: 2001/04/11  MRN: [id number]  Primary Cardiologist: None   Preoperative team, please contact this patient and set up a phone call appointment for further preoperative risk assessment. Please obtain consent and complete medication review. Thank you for your help.  I confirm that guidance regarding antiplatelet and oral anticoagulation therapy has been completed and, if necessary, noted below.  None requested.  I also confirmed the patient resides in the state of [place]. As per [facility] Medical Board telemedicine laws, the patient must reside in the state in which the provider is licensed.   Ronney Asters, NP 10/08/2023, 8:22 AM [facility]

## 2023-10-09 NOTE — Telephone Encounter (Signed)
2nd attempt to reach pt to schedule tele visit. Vm not set up

## 2023-10-13 ENCOUNTER — Telehealth: Payer: Self-pay | Admitting: *Deleted

## 2023-10-13 NOTE — Telephone Encounter (Signed)
I s/w the pt today and she has been scheduled for tele pre op appt 11/04/23. Med rec and consent are done.

## 2023-10-13 NOTE — Telephone Encounter (Signed)
I s/w the pt today and she has been scheduled for tele pre op appt 11/04/23. Med rec and consent are done.     Patient Consent for Virtual Visit        Audrey Callahan has provided verbal consent on 10/13/2023 for a virtual visit (video or telephone).   CONSENT FOR VIRTUAL VISIT FOR:  Audrey Callahan  By participating in this virtual visit I agree to the following:  I hereby voluntarily request, consent and authorize [facility] and its employed or contracted physicians, physician assistants, nurse practitioners or other licensed health care professionals (the Practitioner), to provide me with telemedicine health care services (the "Services") as deemed necessary by the treating Practitioner. I acknowledge and consent to receive the Services by the Practitioner via telemedicine. I understand that the telemedicine visit will involve communicating with the Practitioner through live audiovisual communication technology and the disclosure of certain medical information by electronic transmission. I acknowledge that I have been given the opportunity to request an in-person assessment or other available alternative prior to the telemedicine visit and am voluntarily participating in the telemedicine visit.  I understand that I have the right to withhold or withdraw my consent to the use of telemedicine in the course of my care at any time, without affecting my right to future care or treatment, and that the Practitioner or I may terminate the telemedicine visit at any time. I understand that I have the right to inspect all information obtained and/or recorded in the course of the telemedicine visit and may receive copies of available information for a reasonable fee.  I understand that some of the potential risks of receiving the Services via telemedicine include:  Delay or interruption in medical evaluation due to technological equipment failure or disruption; Information transmitted may not be  sufficient (e.g. poor resolution of images) to allow for appropriate medical decision making by the Practitioner; and/or  In rare instances, security protocols could fail, causing a breach of personal health information.  Furthermore, I acknowledge that it is my responsibility to provide information about my medical history, conditions and care that is complete and accurate to the best of my ability. I acknowledge that Practitioner's advice, recommendations, and/or decision may be based on factors not within their control, such as incomplete or inaccurate data provided by me or distortions of diagnostic images or specimens that may result from electronic transmissions. I understand that the practice of medicine is not an exact science and that Practitioner makes no warranties or guarantees regarding treatment outcomes. I acknowledge that a copy of this consent can be made available to me via my patient portal [facility] MyChart), or I can request a printed copy by calling the office of [facility].    I understand that my insurance will be billed for this visit.   I have read or had this consent read to me. I understand the contents of this consent, which adequately explains the benefits and risks of the Services being provided via telemedicine.  I have been provided ample opportunity to ask questions regarding this consent and the Services and have had my questions answered to my satisfaction. I give my informed consent for the services to be provided through the use of telemedicine in my medical care

## 2023-11-04 ENCOUNTER — Ambulatory Visit: Payer: BC Managed Care – PPO | Attending: Cardiology | Admitting: Nurse Practitioner

## 2023-11-04 DIAGNOSIS — Z01810 Encounter for preprocedural cardiovascular examination: Secondary | ICD-10-CM

## 2023-11-04 NOTE — Progress Notes (Signed)
Virtual Visit via Telephone Note   Because of Audrey Callahan's co-morbid illnesses, she is at least at moderate risk for complications without adequate follow up.  This format is felt to be most appropriate for this patient at this time.  The patient did not have access to video technology/had technical difficulties with video requiring transitioning to audio format only (telephone).  All issues noted in this document were discussed and addressed.  No physical exam could be performed with this format.  Please refer to the patient's chart for her consent to telehealth for [facility].  Evaluation Performed:  Preoperative cardiovascular risk assessment _____________   Date:  11/04/2023   Patient ID:  Audrey Callahan, DOB 12-14-2001, MRN [id number] Patient Location:  Home Provider location:   Office  Primary Care Provider:  Soundra Pilon, FNP Primary Cardiologist:  None  Chief Complaint / Patient Profile   22 y.o. 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 Liliane Shi of [facility] and presents today for telephonic preoperative cardiovascular risk assessment.  History of Present Illness    Audrey Callahan is a 22 y.o. female who presents via audio/video conferencing for a telehealth visit today.  Pt was last seen in cardiology clinic on 02/18/2023 by Dr. Shirlee Latch.  At that time Taila Mcalhany was doing well.  The patient is now pending procedure as outlined above. Since her last visit, she has been stable from a cardiac standpoint.  She notes occasional dizziness in the setting of vertigo.  She had 1 episode of chest discomfort that occurred while eating, she denies any exertional symptoms concerning for angina.  She denies chest pain, palpitations, dyspnea, pnd, orthopnea, n, v, dizziness, syncope, edema,  weight gain, or early satiety. All other systems are otherwise negative except as noted above.   Past Medical History    Past Medical History:  Diagnosis Date   Anemia    Anxiety    Asthma    Depression    POTS (postural orthostatic tachycardia syndrome)    Past Surgical History:  Procedure Laterality Date   MULTIPLE TOOTH EXTRACTIONS  2013   NASAL HEMORRHAGE CONTROL Bilateral 08/28/2022   Procedure: EPISTAXIS CONTROL;  Surgeon: Laren Boom, DO;  Location: [facility] OR;  Service: ENT;  Laterality: Bilateral;   NASAL SEPTOPLASTY W/ TURBINOPLASTY Bilateral 08/28/2022   Procedure: NASAL SEPTOPLASTY WITH TURBINATE REDUCTION AND REPAIR OF NASAL VALVE COLLAPSE;  Surgeon: Laren Boom, DO;  Location: [facility] OR;  Service: ENT;  Laterality: Bilateral;   NASAL SEPTUM SURGERY  08/28/2022    Allergies  Allergies  Allergen Reactions   Lactose Intolerance (Gi)     Upset stomach    Lidocaine Viscous Hcl Nausea And Vomiting   Red Dye #40 (Allura Red) Nausea Only and Rash    Home Medications    Prior to Admission medications   Medication Sig Start Date End Date Taking? Authorizing Provider  buPROPion (WELLBUTRIN XL) 150 MG 24 hr tablet Take 150 mg by mouth every morning. 07/31/22   [provider]  cefdinir (OMNICEF) 300 MG capsule Take 1 capsule (300 mg total) by mouth 2 (two) times daily. 08/26/23   Peter Garter, PA  Cholecalciferol (VITAMIN D) 50 MCG (2000 UT) tablet Take 2,000 Units by mouth daily.    [provider]  drospirenone-ethinyl estradiol (YAZ) 3-0.02 MG tablet Take 1 tablet  by mouth daily. 06/24/22   [provider]  famotidine (PEPCID) 20 MG tablet Take 20 mg by mouth at bedtime. 08/11/22   [provider]  ferrous sulfate 325 (65 FE) MG tablet Take 325 mg by mouth daily.    [provider]  ibuprofen (ADVIL) 200 MG tablet Take 400 mg by mouth every 6 (six) hours as needed for moderate pain.    [provider]  lactase  (LACTAID) 3000 units tablet Take 3,000 Units by mouth daily as needed (lactose intolerance).    [provider]  Melatonin 5 MG CAPS Take 5 mg by mouth at bedtime as needed (sleep).    [provider]  metoprolol succinate (TOPROL-XL) 25 MG 24 hr tablet Take 1 tablet (25 mg total) by mouth at bedtime. 02/18/23   Laurey Morale, MD  omeprazole (PRILOSEC OTC) 20 MG tablet Take 20 mg by mouth daily as needed (acid reflux).    [provider]  Probiotic Product (PROBIOTIC PO) Take 1 capsule by mouth daily.    [provider]  QUEtiapine (SEROQUEL) 100 MG tablet Take 100 mg by mouth at bedtime. Take with 50 mg to equal 150 mg at bedtime 06/19/22   [provider]  QUEtiapine (SEROQUEL) 50 MG tablet Take 50 mg by mouth at bedtime. Take with 100 mg to equal 150 mg at bedtime 08/08/22   [provider]    Physical Exam    Vital Signs:  Zikra Grover does not have vital signs available for review today.  Given telephonic nature of communication, physical exam is limited. AAOx3. NAD. Normal affect.  Speech and respirations are unlabored.  Accessory Clinical Findings    None  Assessment & Plan    1.  Preoperative Cardiovascular Risk Assessment:  According to the Revised Cardiac Risk Index (RCRI), her Perioperative Risk of Major Cardiac Event is (%): 0.9. Her Functional Capacity in METs is: 9.89 according to the Duke Activity Status Index (DASI). Therefore, based on ACC/AHA guidelines, patient would be at acceptable risk for the planned procedure without further cardiovascular testing.   The patient was advised that if she develops new symptoms prior to surgery to contact our office to arrange for a follow-up visit, and she verbalized understanding.   A copy of this note will be routed to requesting surgeon.  Time:   Today, I have spent 5 minutes with the patient with telehealth technology discussing medical history, symptoms, and management  plan.     Joylene Grapes, NP  11/04/2023, 2:30 PM

## 2024-03-16 ENCOUNTER — Other Ambulatory Visit (HOSPITAL_COMMUNITY): Payer: Self-pay | Admitting: Cardiology

## 2024-03-16 MED ORDER — METOPROLOL SUCCINATE ER 25 MG PO TB24: 25.0000 mg | ORAL_TABLET | Freq: Every evening | ORAL | 3 refills | Status: AC

## 2024-04-06 ENCOUNTER — Telehealth (HOSPITAL_COMMUNITY): Payer: Self-pay | Admitting: Cardiology

## 2024-04-06 NOTE — Telephone Encounter (Signed)
 Pt left VM on triage line with concerns regarding CP Report CP/ chest tightness has been going on for months  however event of CP today has left her bent over in pain  Returned call for additional details LMOM -and chart review notes pt is currently in ER

## 2024-04-07 ENCOUNTER — Telehealth (HOSPITAL_COMMUNITY): Payer: Self-pay

## 2024-04-07 NOTE — Progress Notes (Signed)
 [facility] PROGRESS NOTE   Primary Care: Alejandro Hurt, FNP HF Cardiologist: Dr. Mitzie Anda  Reason for Visit: ED f/u for Chest Pain, f/u for HFimEF   HPI: Audrey Callahan is a 24 y.o. female with history of anxiety and depression.    She underwent septoplasty on 08/28/22. Once home, had N/V and returned to ED. Found to by hypoxic with bilateral diffuse infiltrates on CXR and CT, concerning for ARDS from aspiration PNA. Started on IV abx, eventually decompensated and required vasopressors. Echo showed EF 35-40%, RV okay, no significant valvular disease, IVC not dilated. AHF consulted. cMRI showed LVEF 46%, mild hypokinesis in all mid-ventricular wall segments, no LGE.  Hs-Trop elevated but no chest pain & ECG normal; felt related to demand ischemia, so ischemic work up deferred. 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 Started on GDMT after vasopressors weaned. Discharged home, weight 120 lbs.    Follow up 9/23, low dose spiro added. This was later stopped 2/2 increased fatigue and dizziness.  Echo in 11/23 showed EF 55%, normal diastolic function, normal RV.   She presents today for post ED f/u. Seen at [facility] ED earlier this week for CP. Trops were negative x 2. CXR demonstrated normal cardiac and mediastinal contours. No pleural effusion or pneumothorax. There was a rounded density measuring approximately 1.1 cm in the left upper lung zone overlying the left posterior sixth rib. Otherwise, the lungs were clear. The bones and the upper abdomen are unremarkable. Per report note, reading radiologist felt rounded density in LUL was likely EKG pad. Chest pain felt to be musculoskeletal. She was prescribed high dose ibuprofen 600 mg q8hr PRN +  Roboxin. Instructed to f/u w/ primary cardiology team.   Today in f/u, she reports she is doing fairly well. C/w some mild chest discomfort that is reproducible w/ palpation of chest wall. Not worsened by exertion nor  meals. She has not picked up Rx for ibuprofen or Roboxin yet.   NYHA Class I. BP 100/78. EKG shows NSR incomplete RBBB (chronic).   ECG (personally reviewed): NSR w/ iRBBB (chronic), 73 bpm   Labs (9/23): K 3.6, creatinine 0.55 Labs (10/23): K 4.2, creatinine 0.71, BNP 7.8 Labs (4/25): Trop 5>>0, Hgb 12.7, K 3.8, SCr 0.72, Mg 2.0   PMH: 1. Anxiety/depression 2. 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  This occurred after septoplasty in 9/23 with suspected aspiration post-op.   - Echo (9/23): EF 35-40%, RV okay, no significant valvular disease - cMRI (9/23): LVEF 46%, hypokinesis of all mid-ventricular wall segments, RVEF 48%, no LGE, possible Takotsubo CM. - Echo (11/23): EF 55%, normal diastolic function, normal RV.  3. GERD 4. Possible mild POTS  ROS: All systems reviewed and negative except as per HPI.    Current Outpatient Medications  Medication Sig Dispense Refill   buPROPion  (WELLBUTRIN  XL) 150 MG 24 hr tablet Take 150 mg by mouth every morning.     cefdinir  (OMNICEF ) 300 MG capsule Take 1 capsule (300 mg total) by mouth 2 (two) times daily. 12 capsule 0   Cholecalciferol (VITAMIN D) 50 MCG (2000 UT) tablet Take 2,000 Units by mouth daily.     drospirenone-ethinyl estradiol (YAZ) 3-0.02 MG tablet Take 1 tablet by mouth daily.     famotidine  (PEPCID ) 20 MG tablet Take 20 mg by mouth at bedtime.     ferrous sulfate 325 (65 FE) MG tablet Take 325 mg by mouth daily.     ibuprofen (ADVIL) 200 MG tablet Take 400  mg by mouth every 6 (six) hours as needed for moderate pain.     lactase (LACTAID) 3000 units tablet Take 3,000 Units by mouth daily as needed (lactose intolerance).     Melatonin 5 MG CAPS Take 5 mg by mouth at bedtime as needed (sleep).     metoprolol  succinate (TOPROL -XL) 25 MG 24 hr tablet Take 1 tablet (25 mg total) by mouth at bedtime. 90 tablet 3   omeprazole (PRILOSEC OTC) 20 MG tablet Take 20 mg by mouth daily as needed  (acid reflux).     Probiotic Product (PROBIOTIC PO) Take 1 capsule by mouth daily.     QUEtiapine (SEROQUEL) 100 MG tablet Take 100 mg by mouth at bedtime. Take with 50 mg to equal 150 mg at bedtime     QUEtiapine (SEROQUEL) 50 MG tablet Take 50 mg by mouth at bedtime. Take with 100 mg to equal 150 mg at bedtime     No current facility-administered medications for this visit.   Allergies  Allergen Reactions   Lactose Intolerance (Gi)     Upset stomach    Lidocaine  Viscous Hcl Nausea And Vomiting   Red Dye #40 (Allura Red) Nausea Only and Rash   Social History   Socioeconomic History   Marital status: Single    Spouse name: Not on file   Number of children: Not on file   Years of education: Not on file   Highest education level: Not on file  Occupational History   Not on file  Tobacco Use   Smoking status: Never    Passive exposure: Never   Smokeless tobacco: Never  Vaping Use   Vaping status: Never Used  Substance and Sexual Activity   Alcohol use: Yes    Alcohol/week: 3.0 standard drinks of alcohol    Types: 3 Glasses of wine per week    Comment: pt reports 2-3 drinks twice a month   Drug use: Never   Sexual activity: Yes    Birth control/protection: Pill  Other Topics Concern   Not on file  Social History Narrative   Not on file   Social Drivers of Health   Financial Resource Strain: Not on file  Food Insecurity: No Food Insecurity (09/13/2022)   Hunger Vital Sign    Worried About Running Out of Food in the Last Year: Never true    Ran Out of Food in the Last Year: Never true  Transportation Needs: No Transportation Needs (09/13/2022)   PRAPARE - Administrator, Civil Service (Medical): No    Lack of Transportation (Non-Medical): No  Physical Activity: Not on file  Stress: Not on file  Social Connections: Not on file  Intimate Partner Violence: Not At Risk (04/06/2024)   Received from [facility]   HITS    Over the last 12 months how often did  your partner physically hurt you?: Never    Over the last 12 months how often did your partner insult you or talk down to you?: Never    Over the last 12 months how often did your partner threaten you with physical harm?: Never    Over the last 12 months how often did your partner scream or curse at you?: Never   Family History  Adopted: Yes   There were no vitals taken for this visit.  Wt Readings from Last 3 Encounters:  08/25/23 60.1 kg (132 lb 7.9 oz)  02/18/23 60.1 kg (132 lb 6.4 oz)  12/17/22 59.8 kg (131  lb 12.8 oz)   PHYSICAL EXAM: General:  Well appearing, young. No respiratory difficulty HEENT: normal Neck: supple. no JVD. Carotids 2+ bilat; no bruits. No lymphadenopathy or thyromegaly appreciated. Cor: PMI nondisplaced. Regular rate & rhythm. No rubs, gallops or murmurs. + reproducible chest wall pain Lungs: clear Abdomen: soft, nontender, nondistended. No hepatosplenomegaly. No bruits or masses. Good bowel sounds. Extremities: no cyanosis, clubbing, rash, edema Neuro: alert & oriented x 3, cranial nerves grossly intact. moves all 4 extremities w/o difficulty. Affect pleasant.   ASSESSMENT & PLAN: 1.  Chronic systolic CHF: Echo 9/23 with EF 35-40%, normal RV, trivial MR, IVC not dilated. Family history unknown (adopted).  No ETOH/drugs reported.  Has never been pregnant. Cardiac MRI (9/23) LVEF 46% mild hypokinesis of all mid-ventricular segments, no LGE/myocarditis. 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  Echo in 11/23 showed resolution of cardiomyopathy with EF 55% with normal diastolic function and normal RV, this lends credence to the diagnosis of Takotsubo CMP. NYHA Class I. Euvolemic on exam  - Continue Toprol  25 mg at bedtime - w/ improved EF, low normal BP and h/o POTS/orthostasis, will not add other GDMT 2. Sinus tachycardia: Has been a chronic issue for her. Thyroid  indices were normal.  Suspect she has  mild POTS.  She reports improvement in HRs w/ ? blocker, No syncope/near syncope - Continue Toprol  XL 25 mg daily  - Encouraged to stay well hydrated, can increase salt intake when needed  3. Hypertriglyceridemia: recent labs at PCP office showed TG elevated at 229, LDL 79 - encouraged lifestyle modification, increase physical activity and reduce intake of high fat foods - PCP to follow 4. Chest Pain: agree w/ ED w/u done at [facility]. Chest pain c/w musculoskeletal etiology. Trops negative. EKG unremarkable. No exertional symptoms. + reproducible chest wall pain w/ palpitation  - needs to pick up Rx for Roboxin, can take Ibuprofen PRN    Followup in 1 year w/ Dr. Mitzie Anda. Advised to f/u sooner if any change in functional status including dyspnea, edema or palpitations.     Ruddy Corral, PA-C 04/07/2024

## 2024-04-07 NOTE — Telephone Encounter (Signed)
 Called to confirm/remind patient of their appointment at the [facility] on 04/08/2024 12:00.   Appointment:   [x] Confirmed  [] Left mess   [] No answer/No voice mail  [] VM Full/unable to leave message  [] Phone not in service  Patient reminded to bring all medications and/or complete list.  Confirmed patient has transportation. Gave directions, instructed to utilize valet parking.

## 2024-04-08 ENCOUNTER — Encounter (HOSPITAL_COMMUNITY): Payer: Self-pay

## 2024-04-08 ENCOUNTER — Ambulatory Visit (HOSPITAL_COMMUNITY)
Admission: RE | Admit: 2024-04-08 | Discharge: 2024-04-08 | Disposition: A | Discharge status: Home / Self Care | Source: Ambulatory Visit | Attending: Cardiology | Admitting: Cardiology

## 2024-04-08 VITALS — BP 100/78 | HR 74 | Ht 64.5 in | Wt 143.8 lb

## 2024-04-08 DIAGNOSIS — R000 Tachycardia, unspecified: Secondary | ICD-10-CM | POA: Diagnosis not present

## 2024-04-08 DIAGNOSIS — R0789 Other chest pain: Secondary | ICD-10-CM | POA: Diagnosis not present

## 2024-04-08 DIAGNOSIS — I5032 Chronic diastolic (congestive) heart failure: Secondary | ICD-10-CM | POA: Diagnosis present

## 2024-04-08 DIAGNOSIS — Z79899 Other long term (current) drug therapy: Secondary | ICD-10-CM | POA: Insufficient documentation

## 2024-04-08 DIAGNOSIS — I4519 Other right bundle-branch block: Secondary | ICD-10-CM | POA: Diagnosis not present

## 2024-04-08 DIAGNOSIS — R079 Chest pain, unspecified: Secondary | ICD-10-CM | POA: Diagnosis present

## 2024-04-08 DIAGNOSIS — I5022 Chronic systolic (congestive) heart failure: Secondary | ICD-10-CM | POA: Insufficient documentation

## 2024-04-08 NOTE — Patient Instructions (Addendum)
 Great to see you today!!  No changes, continue current medications  Your physician recommends that you schedule a follow-up appointment in: 1 year (April 2026), **PLEASE CALL OUR OFFICE IN FEBRUARY TO SCHEDULE THIS APPOINTMENT  If you have any questions or concerns before your next appointment please send us  a message through [place] or call our office at [phone].    TO LEAVE A MESSAGE FOR THE NURSE SELECT OPTION 2, PLEASE LEAVE A MESSAGE INCLUDING: YOUR NAME DATE OF BIRTH CALL BACK NUMBER REASON FOR CALL**this is important as we prioritize the call backs  YOU WILL RECEIVE A CALL BACK THE SAME DAY AS LONG AS YOU CALL BEFORE 4:00 PM  At the [facility], you and your health needs are our priority. As part of our continuing mission to provide you with exceptional heart care, we have created designated Provider Care Teams. These Care Teams include your primary Cardiologist (physician) and Advanced Practice Providers (APPs- Physician Assistants and Nurse Practitioners) who all work together to provide you with the care you need, when you need it.   You may see any of the following providers on your designated Care Team at your next follow up: Dr Jules Oar Dr Peder Bourdon Dr. Alwin Baars Dr. Arta Lark Amy Marijane Shoulders, NP Ruddy Corral, [place] [facility] [place], [place] Dennise Fitz, NP [place] Lee, NP Shawnee Dellen, NP Luster Salters, PharmD Bevely Brush, PharmD   Please be sure to bring in all your medications bottles to every appointment.    Thank you for choosing [facility]

## 2024-07-21 ENCOUNTER — Other Ambulatory Visit: Payer: Self-pay | Admitting: Medical Genetics

## 2024-08-13 ENCOUNTER — Other Ambulatory Visit (HOSPITAL_COMMUNITY)
Admission: RE | Admit: 2024-08-13 | Discharge: 2024-08-13 | Disposition: A | Discharge status: Home / Self Care | Payer: Self-pay | Source: Ambulatory Visit | Attending: Oncology | Admitting: Oncology

## 2024-08-23 LAB — GENECONNECT MOLECULAR SCREEN: Genetic Analysis Overall Interpretation: NEGATIVE
